# Patient Record
Sex: Female | Born: 1955 | Race: White | Hispanic: No | Marital: Married | Smoking: Former smoker
Health system: Southern US, Community
[De-identification: ages and names within clinical notes are randomized; demographics above are authoritative.]

## PROBLEM LIST (undated history)

## (undated) DIAGNOSIS — M810 Age-related osteoporosis without current pathological fracture: Secondary | ICD-10-CM

## (undated) DIAGNOSIS — I10 Essential (primary) hypertension: Secondary | ICD-10-CM

## (undated) DIAGNOSIS — J45909 Unspecified asthma, uncomplicated: Secondary | ICD-10-CM

## (undated) DIAGNOSIS — K219 Gastro-esophageal reflux disease without esophagitis: Secondary | ICD-10-CM

## (undated) DIAGNOSIS — T7840XA Allergy, unspecified, initial encounter: Secondary | ICD-10-CM

## (undated) HISTORY — PX: NASAL SEPTUM SURGERY: SHX37

## (undated) HISTORY — DX: Gastro-esophageal reflux disease without esophagitis: K21.9

## (undated) HISTORY — PX: COLONOSCOPY: SHX174

## (undated) HISTORY — DX: Age-related osteoporosis without current pathological fracture: M81.0

## (undated) HISTORY — DX: Allergy, unspecified, initial encounter: T78.40XA

## (undated) HISTORY — DX: Unspecified asthma, uncomplicated: J45.909

## (undated) HISTORY — DX: Essential (primary) hypertension: I10

---

## 2000-01-03 ENCOUNTER — Other Ambulatory Visit: Admission: RE | Admit: 2000-01-03 | Discharge: 2000-01-03 | Payer: Self-pay | Admitting: Family Medicine

## 2000-02-28 ENCOUNTER — Other Ambulatory Visit: Admission: RE | Admit: 2000-02-28 | Discharge: 2000-02-28 | Payer: Self-pay | Admitting: Gynecology

## 2000-02-28 ENCOUNTER — Encounter (INDEPENDENT_AMBULATORY_CARE_PROVIDER_SITE_OTHER): Payer: Self-pay

## 2000-03-20 ENCOUNTER — Other Ambulatory Visit: Admission: RE | Admit: 2000-03-20 | Discharge: 2000-03-20 | Payer: Self-pay | Admitting: Gastroenterology

## 2000-03-20 ENCOUNTER — Encounter (INDEPENDENT_AMBULATORY_CARE_PROVIDER_SITE_OTHER): Payer: Self-pay | Admitting: Specialist

## 2000-10-23 ENCOUNTER — Other Ambulatory Visit: Admission: RE | Admit: 2000-10-23 | Discharge: 2000-10-23 | Payer: Self-pay | Admitting: Gynecology

## 2001-11-26 ENCOUNTER — Other Ambulatory Visit: Admission: RE | Admit: 2001-11-26 | Discharge: 2001-11-26 | Payer: Self-pay | Admitting: Gynecology

## 2002-02-18 ENCOUNTER — Other Ambulatory Visit: Admission: RE | Admit: 2002-02-18 | Discharge: 2002-02-18 | Payer: Self-pay | Admitting: Gynecology

## 2003-09-29 ENCOUNTER — Other Ambulatory Visit: Admission: RE | Admit: 2003-09-29 | Discharge: 2003-09-29 | Payer: Self-pay | Admitting: Gynecology

## 2004-11-28 ENCOUNTER — Other Ambulatory Visit: Admission: RE | Admit: 2004-11-28 | Discharge: 2004-11-28 | Payer: Self-pay | Admitting: Gynecology

## 2005-07-25 ENCOUNTER — Other Ambulatory Visit: Admission: RE | Admit: 2005-07-25 | Discharge: 2005-07-25 | Payer: Self-pay | Admitting: Gynecology

## 2008-12-29 ENCOUNTER — Encounter: Admission: RE | Admit: 2008-12-29 | Discharge: 2008-12-29 | Payer: Self-pay | Admitting: Allergy and Immunology

## 2009-10-19 ENCOUNTER — Encounter: Admission: RE | Admit: 2009-10-19 | Discharge: 2009-10-19 | Payer: Self-pay | Admitting: Allergy and Immunology

## 2010-09-26 ENCOUNTER — Ambulatory Visit (HOSPITAL_COMMUNITY): Admission: RE | Admit: 2010-09-26 | Discharge: 2010-09-26 | Payer: Self-pay | Admitting: Gastroenterology

## 2016-08-08 ENCOUNTER — Other Ambulatory Visit: Payer: Self-pay | Admitting: Family Medicine

## 2016-08-08 ENCOUNTER — Ambulatory Visit
Admission: RE | Admit: 2016-08-08 | Discharge: 2016-08-08 | Disposition: A | Discharge status: Home / Self Care | Payer: BLUE CROSS/BLUE SHIELD | Source: Ambulatory Visit | Attending: Family Medicine | Admitting: Family Medicine

## 2016-08-08 DIAGNOSIS — R52 Pain, unspecified: Secondary | ICD-10-CM

## 2016-08-15 ENCOUNTER — Ambulatory Visit (INDEPENDENT_AMBULATORY_CARE_PROVIDER_SITE_OTHER): Payer: BLUE CROSS/BLUE SHIELD | Admitting: Podiatry

## 2016-08-15 ENCOUNTER — Encounter: Payer: Self-pay | Admitting: Podiatry

## 2016-08-15 ENCOUNTER — Ambulatory Visit (INDEPENDENT_AMBULATORY_CARE_PROVIDER_SITE_OTHER): Payer: BLUE CROSS/BLUE SHIELD

## 2016-08-15 VITALS — BP 149/86 | HR 92 | Resp 16 | Ht 64.0 in | Wt 160.0 lb

## 2016-08-15 DIAGNOSIS — M25572 Pain in left ankle and joints of left foot: Secondary | ICD-10-CM | POA: Diagnosis not present

## 2016-08-15 DIAGNOSIS — M722 Plantar fascial fibromatosis: Secondary | ICD-10-CM

## 2016-08-15 MED ORDER — TRIAMCINOLONE ACETONIDE 10 MG/ML IJ SUSP
10.0000 mg | Freq: Once | INTRAMUSCULAR | Status: AC
  Administered 2016-08-15: 10 mg

## 2016-08-15 MED ORDER — DICLOFENAC SODIUM 75 MG PO TBEC: 75.0000 mg | DELAYED_RELEASE_TABLET | Freq: Two times a day (BID) | ORAL | 2 refills | Status: DC

## 2016-08-15 NOTE — Progress Notes (Signed)
   Subjective:    Patient ID: Carol Odom, female    DOB: [birth year], 60 y.o.   MRN: [id number]  HPI  Chief Complaint  Patient presents with  . Foot Pain    L heel pain x over a yr.  Also, some anteroir ankle pain and top of foot "kinda goes along with the heel pain."         Review of Systems  All other systems reviewed and are negative.      Objective:   Physical Exam        Assessment & Plan:

## 2016-08-17 NOTE — Progress Notes (Signed)
Subjective:     Patient ID: Carol Odom, female   DOB: 1955/11/24, 60 y.o.   MRN: [id number]  HPI patient presents stating that she has a lot of discomfort in the plantar heel left and it's been present for around a year and she has tried to modify shoes over-the-counter insoles and stretching exercises without relief   Review of Systems  All other systems reviewed and are negative.      Objective:   Physical Exam  Constitutional: She is oriented to person, place, and time.  Cardiovascular: Intact distal pulses.   Musculoskeletal: Normal range of motion.  Neurological: She is oriented to person, place, and time.  Skin: Skin is warm.  Nursing note and vitals reviewed.  Neurovascular status intact muscle strength adequate range of motion within normal limits with patient noted to have quite a bit of discomfort in the plantar left heel at the insertional point of the tendon into the calcaneus with inflammation and fluid around the medial band. Also is noted to have ankle pain left that's localized in nature with no proximal problems     Assessment:     Acute plantar fasciitis left with inflammation and fluid of the plantar heel noted with probable compensatory pain with all of this been present for a long time    Plan:     H&P and x-rays reviewed and at this time I did discuss the acute and chronic nature of condition. I injected the plantar fascial left 3 mg Kenalog 5 mg Xylocaine and applied fascial brace with instructions on usage and discussed considerations for long-term orthotics and that also due to the long-standing nature of this it may be a surgical consideration. Reappoint to recheck to evaluate in 2 weeks  X-ray report indicate spur formation with no indications of stress fracture arthritis

## 2016-08-27 ENCOUNTER — Encounter: Payer: Self-pay | Admitting: Podiatry

## 2016-08-27 ENCOUNTER — Ambulatory Visit (INDEPENDENT_AMBULATORY_CARE_PROVIDER_SITE_OTHER): Payer: BLUE CROSS/BLUE SHIELD | Admitting: Podiatry

## 2016-08-27 DIAGNOSIS — M722 Plantar fascial fibromatosis: Secondary | ICD-10-CM

## 2016-08-27 MED ORDER — DICLOFENAC SODIUM 75 MG PO TBEC: 75.0000 mg | DELAYED_RELEASE_TABLET | Freq: Two times a day (BID) | ORAL | 2 refills | Status: DC

## 2016-08-27 MED ORDER — TRIAMCINOLONE ACETONIDE 10 MG/ML IJ SUSP
10.0000 mg | Freq: Once | INTRAMUSCULAR | Status: AC
Start: 2016-08-27 — End: 2016-08-27
  Administered 2016-08-27: 10 mg

## 2016-08-27 NOTE — Patient Instructions (Signed)

## 2016-08-27 NOTE — Progress Notes (Signed)
Subjective:     Patient ID: Carol Odom, female   DOB: 01-11-1956, 60 y.o.   MRN: [id number]  HPI patient presents stating my heel is still hurting me with mild improvement but pain still noted   Review of Systems     Objective:   Physical Exam Neurovascular status intact muscle strength is adequate with discomfort plantar aspect left heel which is a bit improved but is still painful when pressed with moderate depression of the arch    Assessment:     Fasciitis-like symptoms with fluid buildup around the medial band    Plan:     H&P condition reviewed exercises dispensed and begin mobile big 15 mg daily. Reinjected plantar fascia 3 mg Kenalog 5 mg Xylocaine and scanned for custom products

## 2016-09-19 ENCOUNTER — Ambulatory Visit: Payer: BLUE CROSS/BLUE SHIELD

## 2016-09-19 DIAGNOSIS — M722 Plantar fascial fibromatosis: Secondary | ICD-10-CM

## 2016-09-19 NOTE — Patient Instructions (Signed)

## 2017-05-08 ENCOUNTER — Encounter: Payer: Self-pay | Admitting: Podiatry

## 2017-05-08 ENCOUNTER — Ambulatory Visit (INDEPENDENT_AMBULATORY_CARE_PROVIDER_SITE_OTHER): Payer: BLUE CROSS/BLUE SHIELD | Admitting: Podiatry

## 2017-05-08 ENCOUNTER — Ambulatory Visit: Payer: BLUE CROSS/BLUE SHIELD | Admitting: Podiatry

## 2017-05-08 ENCOUNTER — Ambulatory Visit (INDEPENDENT_AMBULATORY_CARE_PROVIDER_SITE_OTHER): Payer: BLUE CROSS/BLUE SHIELD

## 2017-05-08 DIAGNOSIS — R609 Edema, unspecified: Secondary | ICD-10-CM | POA: Diagnosis not present

## 2017-05-08 DIAGNOSIS — M778 Other enthesopathies, not elsewhere classified: Secondary | ICD-10-CM

## 2017-05-08 DIAGNOSIS — M7750 Other enthesopathy of unspecified foot: Secondary | ICD-10-CM

## 2017-05-08 DIAGNOSIS — M779 Enthesopathy, unspecified: Secondary | ICD-10-CM

## 2017-05-08 MED ORDER — TRIAMCINOLONE ACETONIDE 10 MG/ML IJ SUSP
10.0000 mg | Freq: Once | INTRAMUSCULAR | Status: AC
  Administered 2017-05-08: 10 mg

## 2017-05-08 NOTE — Progress Notes (Signed)
Subjective:    Patient ID: Carol Odom, female   DOB: 61 y.o.   MRN: [id number]   HPI patient presents stating she's getting a lot of pain in the outside of the left foot    ROS      Objective:  Physical Exam neurovascular status intact with inflammation pain in the left sinus tarsi with fluid buildup noted     Assessment:   Probable sinus tarsitis left      Plan:   Injected the sinus tarsi left 3 mg Kenalog 5 mg Xylocaine and instructed on physical therapy and reappoint to recheck

## 2020-08-21 ENCOUNTER — Other Ambulatory Visit: Payer: Self-pay | Admitting: Family Medicine

## 2020-08-21 DIAGNOSIS — Z1231 Encounter for screening mammogram for malignant neoplasm of breast: Secondary | ICD-10-CM

## 2020-08-21 DIAGNOSIS — Z13820 Encounter for screening for osteoporosis: Secondary | ICD-10-CM

## 2020-08-23 ENCOUNTER — Ambulatory Visit
Admission: RE | Admit: 2020-08-23 | Discharge: 2020-08-23 | Disposition: A | Discharge status: Home / Self Care | Payer: Medicare HMO | Source: Ambulatory Visit | Attending: Family Medicine | Admitting: Family Medicine

## 2020-08-23 ENCOUNTER — Other Ambulatory Visit: Payer: Self-pay

## 2020-08-23 DIAGNOSIS — Z1231 Encounter for screening mammogram for malignant neoplasm of breast: Secondary | ICD-10-CM

## 2020-12-10 ENCOUNTER — Other Ambulatory Visit: Payer: Self-pay

## 2020-12-10 ENCOUNTER — Ambulatory Visit
Admission: RE | Admit: 2020-12-10 | Discharge: 2020-12-10 | Disposition: A | Discharge status: Home / Self Care | Payer: BLUE CROSS/BLUE SHIELD | Source: Ambulatory Visit | Attending: Family Medicine | Admitting: Family Medicine

## 2020-12-10 DIAGNOSIS — Z13820 Encounter for screening for osteoporosis: Secondary | ICD-10-CM

## 2021-01-25 ENCOUNTER — Telehealth: Payer: Self-pay | Admitting: Podiatry

## 2021-01-25 NOTE — Telephone Encounter (Signed)
Pt called asking about orthotics. She got a pair several years ago and was wanting to discuss another pr. Pt has [place] and I explained that the code we use for orthotics  does not meet criteria for medicare to cover them and they are 438.00. She said ok and was going to hang up and I told her about the option of refurbishing the old ones and she said that is great and is aware that it is 90.00 and is due when pt drops off the orthotics to be refurbished. I told pt they take a few weeks to refurbish and I call when they come in for pt to pick up. She said ok great and thank you.

## 2021-02-26 DIAGNOSIS — M722 Plantar fascial fibromatosis: Secondary | ICD-10-CM

## 2021-03-14 ENCOUNTER — Telehealth: Payer: Self-pay | Admitting: Podiatry

## 2021-03-14 NOTE — Telephone Encounter (Signed)
Refurbished orthotics in.. pt aware ok to pick up.

## 2021-03-19 ENCOUNTER — Ambulatory Visit (INDEPENDENT_AMBULATORY_CARE_PROVIDER_SITE_OTHER): Payer: Medicare HMO | Admitting: Podiatry

## 2021-03-19 ENCOUNTER — Other Ambulatory Visit: Payer: Self-pay

## 2021-03-19 DIAGNOSIS — M779 Enthesopathy, unspecified: Secondary | ICD-10-CM

## 2021-03-19 NOTE — Progress Notes (Signed)
Patient presents today stating the the orthotics that she got were too long. Patient also stated that she had yet to put them in the shoes.  I trimmed the orthotics down and they did go in the shoes and were comfortable and patient would like to have a metatarsal pad put in the arch area due to the over the counter orthotics which the patient is wearing feels better with them.  Patient will be contacted when the orthotics are ready for pick up.

## 2021-04-03 ENCOUNTER — Other Ambulatory Visit: Payer: Self-pay

## 2021-04-03 ENCOUNTER — Ambulatory Visit (INDEPENDENT_AMBULATORY_CARE_PROVIDER_SITE_OTHER): Payer: Medicare HMO | Admitting: Podiatry

## 2021-04-03 DIAGNOSIS — M779 Enthesopathy, unspecified: Secondary | ICD-10-CM

## 2021-04-03 NOTE — Patient Instructions (Signed)

## 2021-04-03 NOTE — Progress Notes (Signed)
Patient presents today for orthotic pick up. Patient voices no new complaints.  Orthotics were fitted to patient's feet. No discomfort and no rubbing. Patient satisfied with the orthotics.  Orthotics were dispensed to patient with instructions for break in wear and to call the office with any concerns or questions. 

## 2022-01-07 DIAGNOSIS — K5190 Ulcerative colitis, unspecified, without complications: Secondary | ICD-10-CM | POA: Diagnosis not present

## 2022-01-13 ENCOUNTER — Other Ambulatory Visit: Payer: Self-pay | Admitting: Family Medicine

## 2022-01-13 DIAGNOSIS — Z1231 Encounter for screening mammogram for malignant neoplasm of breast: Secondary | ICD-10-CM

## 2022-01-23 ENCOUNTER — Ambulatory Visit
Admission: RE | Admit: 2022-01-23 | Discharge: 2022-01-23 | Disposition: A | Discharge status: Home / Self Care | Payer: Medicare HMO | Source: Ambulatory Visit | Attending: Family Medicine | Admitting: Family Medicine

## 2022-01-23 DIAGNOSIS — Z1231 Encounter for screening mammogram for malignant neoplasm of breast: Secondary | ICD-10-CM | POA: Diagnosis not present

## 2022-04-30 ENCOUNTER — Ambulatory Visit (INDEPENDENT_AMBULATORY_CARE_PROVIDER_SITE_OTHER): Payer: Medicare HMO | Admitting: Nurse Practitioner

## 2022-04-30 ENCOUNTER — Encounter: Payer: Self-pay | Admitting: Nurse Practitioner

## 2022-04-30 VITALS — BP 122/69 | HR 88 | Temp 97.5°F | Ht 64.17 in | Wt 153.8 lb

## 2022-04-30 DIAGNOSIS — Z8719 Personal history of other diseases of the digestive system: Secondary | ICD-10-CM | POA: Insufficient documentation

## 2022-04-30 DIAGNOSIS — Z7689 Persons encountering health services in other specified circumstances: Secondary | ICD-10-CM | POA: Diagnosis not present

## 2022-04-30 DIAGNOSIS — J4520 Mild intermittent asthma, uncomplicated: Secondary | ICD-10-CM | POA: Diagnosis not present

## 2022-04-30 DIAGNOSIS — K219 Gastro-esophageal reflux disease without esophagitis: Secondary | ICD-10-CM

## 2022-04-30 DIAGNOSIS — Z1211 Encounter for screening for malignant neoplasm of colon: Secondary | ICD-10-CM | POA: Diagnosis not present

## 2022-04-30 MED ORDER — OMEPRAZOLE 40 MG PO CPDR: 40.0000 mg | DELAYED_RELEASE_CAPSULE | Freq: Two times a day (BID) | ORAL | 1 refills | Status: DC

## 2022-04-30 NOTE — Progress Notes (Signed)
New Patient Office Visit  Subjective    Patient ID: Carol Odom, female    DOB: 1956/09/26  Age: 66 y.o. MRN: [id number]  CC:  Chief Complaint  Patient presents with   New Patient (Initial Visit)    HPI Carol Odom presents to establish care Her previous primary care provider has retired.  -needs referral to GI for colonoscopy. Recently did yearly colon cancer screening through [facility] and was told she "failed." She does have history of ulcerative colitis and takes [place]. Insurance will not cover this medication for her any longer.  She is due to have routine, fasting blood work and routine physical.   Outpatient Encounter Medications as of 04/30/2022  Medication Sig   acetaminophen (TYLENOL) 325 MG tablet Take by mouth.   albuterol (VENTOLIN HFA) 108 (90 Base) MCG/ACT inhaler SMARTSIG:2 Puff(s) By Mouth Every 6 Hours PRN   Ascorbic Acid (VITAMIN C) 1000 MG tablet Take 1,000 mg by mouth daily.   dicyclomine (BENTYL) 20 MG tablet Take 20-40 mg by mouth every 6 (six) hours as needed.   mesalamine (LIALDA) 1.2 g EC tablet Take by mouth daily with breakfast.   SYMBICORT 80-4.5 MCG/ACT inhaler Inhale 2 puffs into the lungs daily. Pt takes 2 puffs in the morning and 2 puffs in the evening   Vitamin D, Ergocalciferol, (DRISDOL) 1.25 MG (50000 UNIT) CAPS capsule Take 50,000 Units by mouth every 7 (seven) days.   [DISCONTINUED] omeprazole (PRILOSEC) 20 MG capsule Take 20 mg by mouth 2 (two) times daily.   omeprazole (PRILOSEC) 40 MG capsule Take 1 capsule (40 mg total) by mouth 2 (two) times daily.   [DISCONTINUED] ALPRAZolam (XANAX) 0.5 MG tablet Take 0.5 mg by mouth as needed.    [DISCONTINUED] citalopram (CELEXA) 20 MG tablet Take 20 mg by mouth daily.    [DISCONTINUED] diclofenac (VOLTAREN) 75 MG EC tablet Take 1 tablet (75 mg total) by mouth 2 (two) times daily.   No facility-administered encounter medications on file as of 04/30/2022.    History reviewed. No pertinent past  medical history.  History reviewed. No pertinent surgical history.  Family History  Problem Relation Age of Onset   Stroke Mother    High Cholesterol Mother    High Cholesterol Father    Cancer Father    Heart attack Father    High blood pressure Father    Breast cancer Paternal Aunt     Social History   Socioeconomic History   Marital status: Married    Spouse name: Not on file   Number of children: Not on file   Years of education: Not on file   Highest education level: Not on file  Occupational History   Not on file  Tobacco Use   Smoking status: Former   Smokeless tobacco: Never  Vaping Use   Vaping Use: Not on file  Substance and Sexual Activity   Alcohol use: Never   Drug use: Never   Sexual activity: Not Currently  Other Topics Concern   Not on file  Social History Narrative   Not on file   Social Determinants of Health   Financial Resource Strain: Not on file  Food Insecurity: Not on file  Transportation Needs: Not on file  Physical Activity: Not on file  Stress: Not on file  Social Connections: Not on file  Intimate Partner Violence: Not on file    Review of Systems  Constitutional:  Negative for chills, fever and malaise/fatigue.  HENT:  Negative for  congestion, sinus pain and sore throat.   Eyes: Negative.   Respiratory:  Negative for cough, shortness of breath and wheezing.   Cardiovascular:  Negative for chest pain, palpitations and leg swelling.  Gastrointestinal:  Negative for constipation, diarrhea, nausea and vomiting.       Positive yearly colon cancer screen through insurance.  History of ulcerative colitis.   Genitourinary: Negative.   Musculoskeletal:  Negative for myalgias.  Skin: Negative.   Neurological:  Negative for dizziness and headaches.  Endo/Heme/Allergies:  Does not bruise/bleed easily.  Psychiatric/Behavioral:  Negative for depression. The patient is not nervous/anxious.         Objective    Today's Vitals    04/30/22 1350  BP: 122/69  Pulse: 88  Temp: (!) 97.5 F (36.4 C)  SpO2: 99%  Weight: 153 lb 12.8 oz (69.8 kg)  Height: 5' 4.17" (1.63 m)   Body mass index is 26.26 kg/m.   Physical Exam Vitals and nursing note reviewed.  Constitutional:      Appearance: Normal appearance. She is well-developed.  HENT:     Head: Normocephalic and atraumatic.  Eyes:     Pupils: Pupils are equal, round, and reactive to light.  Neck:     Vascular: No carotid bruit.  Cardiovascular:     Rate and Rhythm: Normal rate and regular rhythm.     Pulses: Normal pulses.     Heart sounds: Normal heart sounds.  Pulmonary:     Effort: Pulmonary effort is normal.     Breath sounds: Normal breath sounds.  Abdominal:     Palpations: Abdomen is soft.  Musculoskeletal:        General: Normal range of motion.     Cervical back: Normal range of motion and neck supple.  Lymphadenopathy:     Cervical: No cervical adenopathy.  Skin:    General: Skin is warm and dry.     Capillary Refill: Capillary refill takes less than 2 seconds.  Neurological:     General: No focal deficit present.     Mental Status: She is alert and oriented to person, place, and time.  Psychiatric:        Mood and Affect: Mood normal.        Behavior: Behavior normal.        Thought Content: Thought content normal.        Judgment: Judgment normal.      Assessment & Plan:  1. Gastroesophageal reflux disease without esophagitis Change omeprazole to 55m daily and sent new, 90 day prescription to pharmacy  - omeprazole (PRILOSEC) 40 MG capsule; Take 1 capsule (40 mg total) by mouth 2 (two) times daily.  Dispense: 90 capsule; Refill: 1  2. Mild intermittent asthma without complication Stable. Continue inhalers as previously prescribed   3. History of ulcerative colitis Currently on [place]. Insurance no longer covering. Refer to GI for further evaluation.  - Ambulatory referral to Gastroenterology  4. Screening for colon  cancer Patient recently had positive FOBT per insurance company. Refer to GI for colonoscopy.  - Ambulatory referral to Gastroenterology  5. Encounter to establish care Appointment today to establish new primary care provider. Will review records to update patient chart.    Problem List Items Addressed This Visit       Respiratory   Mild intermittent asthma without complication   Relevant Medications   albuterol (VENTOLIN HFA) 108 (90 Base) MCG/ACT inhaler     Digestive   Gastroesophageal reflux disease without esophagitis -  Primary   Relevant Medications   dicyclomine (BENTYL) 20 MG tablet   mesalamine (LIALDA) 1.2 g EC tablet   omeprazole (PRILOSEC) 40 MG capsule     Other   History of ulcerative colitis   Relevant Orders   Ambulatory referral to Gastroenterology   Other Visit Diagnoses     Screening for colon cancer       Relevant Orders   Ambulatory referral to Gastroenterology   Encounter to establish care           Return in about 4 weeks (around 05/28/2022) for medicare wellness, FBW a week prior to visit.   Ronnell Freshwater, NP

## 2022-05-21 ENCOUNTER — Other Ambulatory Visit: Payer: Medicare HMO

## 2022-05-21 DIAGNOSIS — Z0000 Encounter for general adult medical examination without abnormal findings: Secondary | ICD-10-CM

## 2022-05-22 LAB — COMPREHENSIVE METABOLIC PANEL
ALT: 13 IU/L (ref 0–32)
AST: 16 IU/L (ref 0–40)
Albumin/Globulin Ratio: 1.4 (ref 1.2–2.2)
Albumin: 4 g/dL (ref 3.9–4.9)
Alkaline Phosphatase: 92 IU/L (ref 44–121)
BUN/Creatinine Ratio: 8 — ABNORMAL LOW (ref 12–28)
BUN: 5 mg/dL — ABNORMAL LOW (ref 8–27)
Bilirubin Total: <0.2 mg/dL (ref 0.0–1.2)
CO2: 23 mmol/L (ref 20–29)
Calcium: 9.9 mg/dL (ref 8.7–10.3)
Chloride: 105 mmol/L (ref 96–106)
Creatinine, Ser: 0.66 mg/dL (ref 0.57–1.00)
Globulin, Total: 2.9 g/dL (ref 1.5–4.5)
Glucose: 96 mg/dL (ref 70–99)
Potassium: 4.4 mmol/L (ref 3.5–5.2)
Sodium: 143 mmol/L (ref 134–144)
Total Protein: 6.9 g/dL (ref 6.0–8.5)
eGFR: 96 mL/min/{1.73_m2} (ref >59)

## 2022-05-22 LAB — LIPID PANEL
Chol/HDL Ratio: 3.7 ratio (ref 0.0–4.4)
Cholesterol, Total: 253 mg/dL — ABNORMAL HIGH (ref 100–199)
HDL: 69 mg/dL (ref >39)
LDL Chol Calc (NIH): 160 mg/dL — ABNORMAL HIGH (ref 0–99)
Triglycerides: 138 mg/dL (ref 0–149)
VLDL Cholesterol Cal: 24 mg/dL (ref 5–40)

## 2022-05-22 LAB — CBC
Hematocrit: 43.6 % (ref 34.0–46.6)
Hemoglobin: 14.3 g/dL (ref 11.1–15.9)
MCH: 29.8 pg (ref 26.6–33.0)
MCHC: 32.8 g/dL (ref 31.5–35.7)
MCV: 91 fL (ref 79–97)
Platelets: 369 10*3/uL (ref 150–450)
RBC: 4.8 x10E6/uL (ref 3.77–5.28)
RDW: 13.1 % (ref 11.7–15.4)
WBC: 4.3 10*3/uL (ref 3.4–10.8)

## 2022-05-22 LAB — TSH: TSH: 0.804 u[IU]/mL (ref 0.450–4.500)

## 2022-05-22 LAB — HEMOGLOBIN A1C
Est. average glucose Bld gHb Est-mCnc: 114 mg/dL
Hgb A1c MFr Bld: 5.6 % (ref 4.8–5.6)

## 2022-05-25 NOTE — Progress Notes (Signed)
Elevated LDL and total cholesterol. Other labs good. Discuss at visit 05/28/2022.

## 2022-05-28 ENCOUNTER — Encounter: Payer: Self-pay | Admitting: Nurse Practitioner

## 2022-05-28 ENCOUNTER — Ambulatory Visit (INDEPENDENT_AMBULATORY_CARE_PROVIDER_SITE_OTHER): Payer: Medicare HMO | Admitting: Nurse Practitioner

## 2022-05-28 VITALS — BP 145/69 | HR 80 | Ht 64.0 in | Wt 154.1 lb

## 2022-05-28 DIAGNOSIS — E785 Hyperlipidemia, unspecified: Secondary | ICD-10-CM | POA: Diagnosis not present

## 2022-05-28 DIAGNOSIS — Z1211 Encounter for screening for malignant neoplasm of colon: Secondary | ICD-10-CM | POA: Diagnosis not present

## 2022-05-28 DIAGNOSIS — Z0000 Encounter for general adult medical examination without abnormal findings: Secondary | ICD-10-CM

## 2022-05-28 NOTE — Progress Notes (Signed)
Subjective:   Carol Odom is a 66 y.o. female who presents for Medicare Annual (Subsequent) preventive examination.  Review of Systems    Referrer to PCP    I connected with  Ophelia Shoulder on 05/28/22 by In person and verified that I am speaking with the correct person using two identifiers.   I discussed the limitations, risks, security and privacy concerns of performing an evaluation and management service by telephone and the availability of in person appointments. I also discussed with the patient that there may be a patient responsible charge related to this service. The patient expressed understanding and verbally consented to this telephonic visit.  Location of Patient: office Location of Provider:office  List any persons and their role that are participating in the visit with the patient.  Katrina     Objective:    Today's Vitals   05/28/22 0926  BP: (!) 145/69  Pulse: 80  SpO2: 97%  Weight: 154 lb 1.9 oz (69.9 kg)  Height: 5' 4"  (1.626 m)   Body mass index is 26.45 kg/m.   Current Medications (verified) Outpatient Encounter Medications as of 05/28/2022  Medication Sig   acetaminophen (TYLENOL) 325 MG tablet Take by mouth.   albuterol (VENTOLIN HFA) 108 (90 Base) MCG/ACT inhaler SMARTSIG:2 Puff(s) By Mouth Every 6 Hours PRN   Ascorbic Acid (VITAMIN C) 1000 MG tablet Take 1,000 mg by mouth daily.   dicyclomine (BENTYL) 20 MG tablet Take 20-40 mg by mouth every 6 (six) hours as needed.   mesalamine (LIALDA) 1.2 g EC tablet Take by mouth daily with breakfast.   omeprazole (PRILOSEC) 40 MG capsule Take 1 capsule (40 mg total) by mouth 2 (two) times daily.   SYMBICORT 80-4.5 MCG/ACT inhaler Inhale 2 puffs into the lungs daily. Pt takes 2 puffs in the morning and 2 puffs in the evening   Vitamin D, Ergocalciferol, (DRISDOL) 1.25 MG (50000 UNIT) CAPS capsule Take 50,000 Units by mouth every 7 (seven) days.   No facility-administered encounter medications on file as  of 05/28/2022.    Allergies (verified) Codeine, Prednisone, Augmentin [amoxicillin-pot clavulanate], and Clindamycin/lincomycin   History: History reviewed. No pertinent past medical history. History reviewed. No pertinent surgical history. Family History  Problem Relation Age of Onset   Stroke Mother    High Cholesterol Mother    High Cholesterol Father    Cancer Father    Heart attack Father    High blood pressure Father    Breast cancer Paternal Aunt    Social History   Socioeconomic History   Marital status: Married    Spouse name: Not on file   Number of children: Not on file   Years of education: Not on file   Highest education level: Not on file  Occupational History   Not on file  Tobacco Use   Smoking status: Former   Smokeless tobacco: Never  Vaping Use   Vaping Use: Not on file  Substance and Sexual Activity   Alcohol use: Never   Drug use: Never   Sexual activity: Not Currently  Other Topics Concern   Not on file  Social History Narrative   Not on file   Social Determinants of Health   Financial Resource Strain: Low Risk  (05/28/2022)   Overall Financial Resource Strain (CARDIA)    Difficulty of Paying Living Expenses: Not hard at all  Food Insecurity: No Food Insecurity (05/28/2022)   Hunger Vital Sign    Worried About Running Out of  Food in the Last Year: Never true    [place] in the Last Year: Never true  Transportation Needs: No Transportation Needs (05/28/2022)   PRAPARE - Hydrologist (Medical): No    Lack of Transportation (Non-Medical): No  Physical Activity: Not on file  Stress: No Stress Concern Present (05/28/2022)   [place]    Feeling of Stress : Not at all  Social Connections: Not on file    Tobacco Counseling Non smoker   How often do you need to have someone help you when you read instructions, pamphlets, or other written  materials from your doctor or pharmacy?: (P) 1 - Never  Diabetic?no   Activities of Daily Living    05/28/2022    9:28 AM 05/24/2022    9:08 AM  In your present state of health, do you have any difficulty performing the following activities:  Hearing? 0 0  Vision? 0 0  Difficulty concentrating or making decisions? 0 0  Walking or climbing stairs? 0 0  Dressing or bathing? 0 0  Doing errands, shopping? 0 0  Preparing Food and eating ?  N  Using the Toilet?  N  In the past six months, have you accidently leaked urine?  N  Do you have problems with loss of bowel control?  N  Managing your Medications?  N  Managing your Finances?  N  Housekeeping or managing your Housekeeping?  N    Patient Care Team: Ronnell Freshwater, NP as PCP - General (Family Medicine)  Indicate any recent Medical Services you may have received from other than [facility] providers in the past year (date may be approximate).     Assessment:   1. Encounter for Medicare annual wellness exam Medicare wellness visit today   2. Hyperlipidemia LDL goal <100 [place] labs showing mild elevation of LDL and total cholesterol. Recommend patient limit intake of fried and fatty foods. She should increase intake of lean proteins and green leafy vegetables. Adding exercise into daily routine will also be beneficial.  Recheck fasting lipids in one year   3. Screening for colon cancer Refer to GI for screening colonoscopy  - Ambulatory referral to Gastroenterology   Hearing/Vision screen No results found.  Dietary issues and exercise activities discussed:    Depression Screen    05/28/2022    9:28 AM 04/30/2022    2:00 PM  PHQ 2/9 Scores  PHQ - 2 Score 0 0  PHQ- 9 Score 0 1    Fall Risk    05/24/2022    9:08 AM  Fall Risk   Falls in the past year? 0    FALL RISK PREVENTION PERTAINING TO THE HOME:  Any stairs in or around the home? Yes  If so, are there any without handrails? No  Home free of loose throw rugs  in walkways, pet beds, electrical cords, etc? Yes  Adequate lighting in your home to reduce risk of falls? Yes   ASSISTIVE DEVICES UTILIZED TO PREVENT FALLS:  Life alert? No  Use of a cane, walker or w/c? No  Grab bars in the bathroom? No  Shower chair or bench in shower? No  Elevated toilet seat or a handicapped toilet? Yes   TIMED UP AND GO:  Was the test performed? Yes .  Length of time to ambulate 10 feet: 10 sec.   Gait steady and fast without use of assistive device  Cognitive Function:        05/28/2022    9:28 AM  6CIT Screen  What Year? 0 points  What time? 0 points  Count back from 20 0 points  Months in reverse 0 points  Repeat phrase 2 points    Immunizations  There is no immunization history on file for this patient.  TDAP status: Due, Education has been provided regarding the importance of this vaccine. Advised may receive this vaccine at local pharmacy or Health Dept. Aware to provide a copy of the vaccination record if obtained from local pharmacy or Health Dept. Verbalized acceptance and understanding.  Flu Vaccine status: Declined, Education has been provided regarding the importance of this vaccine but patient still declined. Advised may receive this vaccine at local pharmacy or Health Dept. Aware to provide a copy of the vaccination record if obtained from local pharmacy or Health Dept. Verbalized acceptance and understanding.  Pneumococcal vaccine status: Due, Education has been provided regarding the importance of this vaccine. Advised may receive this vaccine at local pharmacy or Health Dept. Aware to provide a copy of the vaccination record if obtained from local pharmacy or Health Dept. Verbalized acceptance and understanding.  Covid-19 vaccine status: Information provided on how to obtain vaccines.   Qualifies for Shingles Vaccine? Yes   Zostavax completed No   Shingrix Completed?: No.    Education has been provided regarding the importance of  this vaccine. Patient has been advised to call insurance company to determine out of pocket expense if they have not yet received this vaccine. Advised may also receive vaccine at local pharmacy or Health Dept. Verbalized acceptance and understanding.  Screening Tests Health Maintenance  Topic Date Due   Hepatitis C Screening  Never done   TETANUS/TDAP  Never done   COLONOSCOPY (Pts 45-74yr Insurance coverage will need to be confirmed)  Never done   COVID-19 Vaccine (1) 06/13/2022 (Originally 02/25/1957)   Zoster Vaccines- Shingrix (1 of 2) 08/28/2022 (Originally 08/26/2004)   Pneumonia Vaccine 68 Years old (1 - PCV) 05/29/2023 (Originally 08/27/2019)   INFLUENZA VACCINE  06/10/2022   MAMMOGRAM  01/24/2024   DEXA SCAN  Completed   HPV VACCINES  Aged Out    Health Maintenance  Health Maintenance Due  Topic Date Due   Hepatitis C Screening  Never done   TETANUS/TDAP  Never done   COLONOSCOPY (Pts 45-419yrInsurance coverage will need to be confirmed)  Never done    Colorectal cancer screening: Referral to GI placed [facility]. Pt aware the office will call re: appt.  Mammogram status: Completed 3/23. Repeat every year  Bone Density status: Completed 11/2020. Results reflect: Bone density results: OSTEOPOROSIS. Repeat every 2 years.  Lung Cancer Screening: (Low Dose CT Chest recommended if Age 68-80ears, 30 pack-year currently smoking OR have quit w/in 15years.) does not qualify.   Lung Cancer Screening Referral: NO  Additional Screening:  Hepatitis C Screening: does not qualify; Completed N/A  Vision Screening: Recommended annual ophthalmology exams for early detection of glaucoma and other disorders of the eye. Is the patient up to date with their annual eye exam?  Yes  Who is the provider or what is the name of the office in which the patient attends annual eye exams? [facility] If pt is not established with a provider, would they like to be referred to a provider to  establish care? No .   Dental Screening: Recommended annual dental exams for proper oral hygiene  Community  Resource Referral / Chronic Care Management: CRR required this visit?  No   CCM required this visit?  No      Plan:     I have personally reviewed and noted the following in the patient's chart:   Medical and social history Use of alcohol, tobacco or illicit drugs  Current medications and supplements including opioid prescriptions.  Functional ability and status Nutritional status Physical activity Advanced directives List of other physicians Hospitalizations, surgeries, and ER visits in previous 12 months Vitals Screenings to include cognitive, depression, and falls Referrals and appointments  In addition, I have reviewed and discussed with patient certain preventive protocols, quality metrics, and best practice recommendations. A written personalized care plan for preventive services as well as general preventive health recommendations were provided to patient.     Ronnell Freshwater, NP   05/28/2022   Nurse Notes: Face to Face 25 min   Ms. Hundertmark , Thank you for taking time to come for your Medicare Wellness Visit. I appreciate your ongoing commitment to your health goals. Please review the following plan we discussed and let me know if I can assist you in the future.   These are the goals we discussed:  Goals   None     This is a list of the screening recommended for you and due dates:  Health Maintenance  Topic Date Due   Hepatitis C Screening: USPSTF Recommendation to screen - Ages 55-79 yo.  Never done   Tetanus Vaccine  Never done   Colon Cancer Screening  Never done   COVID-19 Vaccine (1) 06/13/2022*   Zoster (Shingles) Vaccine (1 of 2) 08/28/2022*   Pneumonia Vaccine (1 - PCV) 05/29/2023*   Flu Shot  06/10/2022   Mammogram  01/24/2024   DEXA scan (bone density measurement)  Completed   HPV Vaccine  Aged Out  *Topic was postponed. The date shown is  not the original due date.

## 2022-06-02 IMAGING — MG MM DIGITAL SCREENING BILAT W/ TOMO AND CAD
8 series · 8 of 24 positions shown · non-contrast
Comparison: Previous exam(s).

CLINICAL DATA: Screening.

EXAM:
DIGITAL SCREENING BILATERAL MAMMOGRAM WITH TOMOSYNTHESIS AND CAD
TECHNIQUE: Bilateral screening digital craniocaudal and mediolateral oblique
mammograms were obtained. Bilateral screening digital breast
tomosynthesis was performed. The images were evaluated with
computer-aided detection.

[R CC synth-2D]
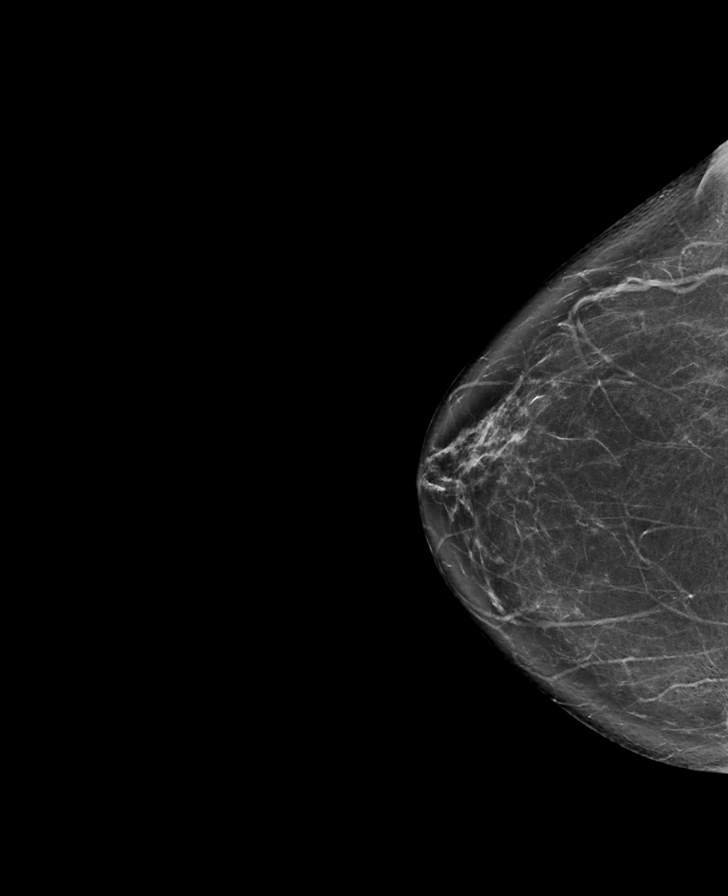

[L CC synth-2D]
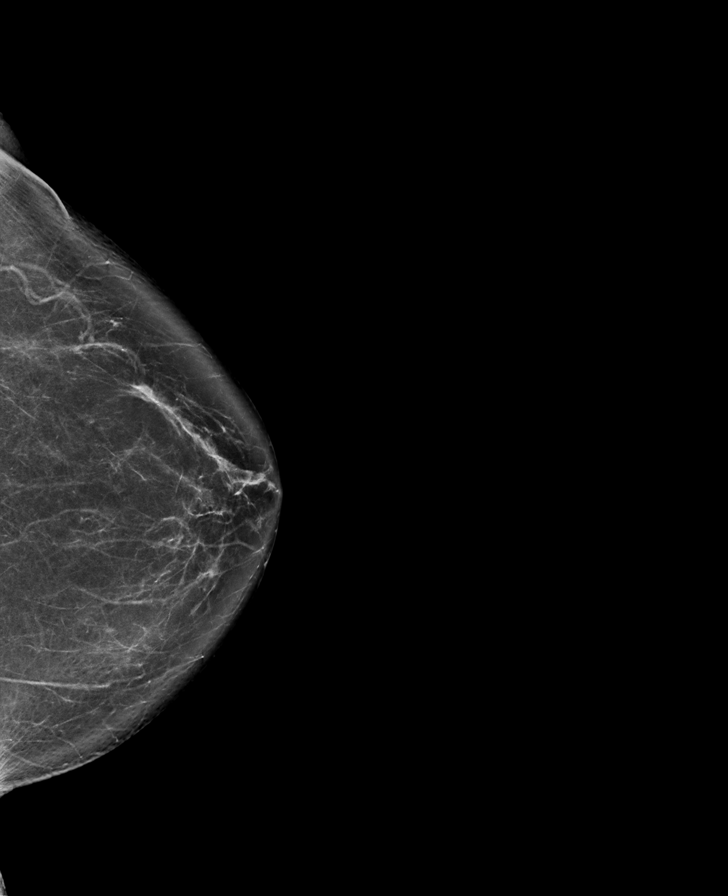

[R MLO synth-2D]
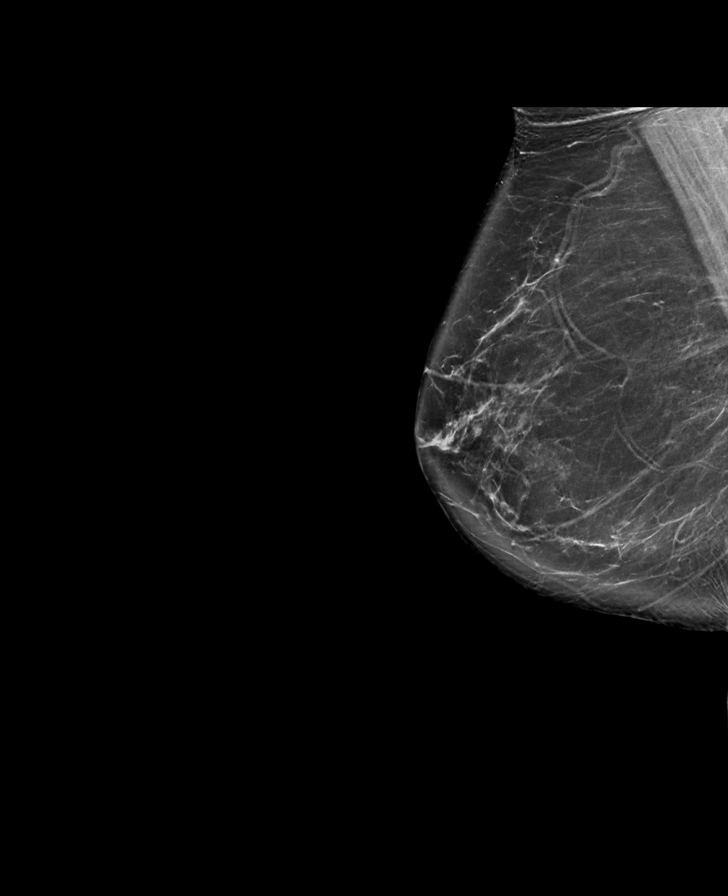

[L MLO synth-2D]
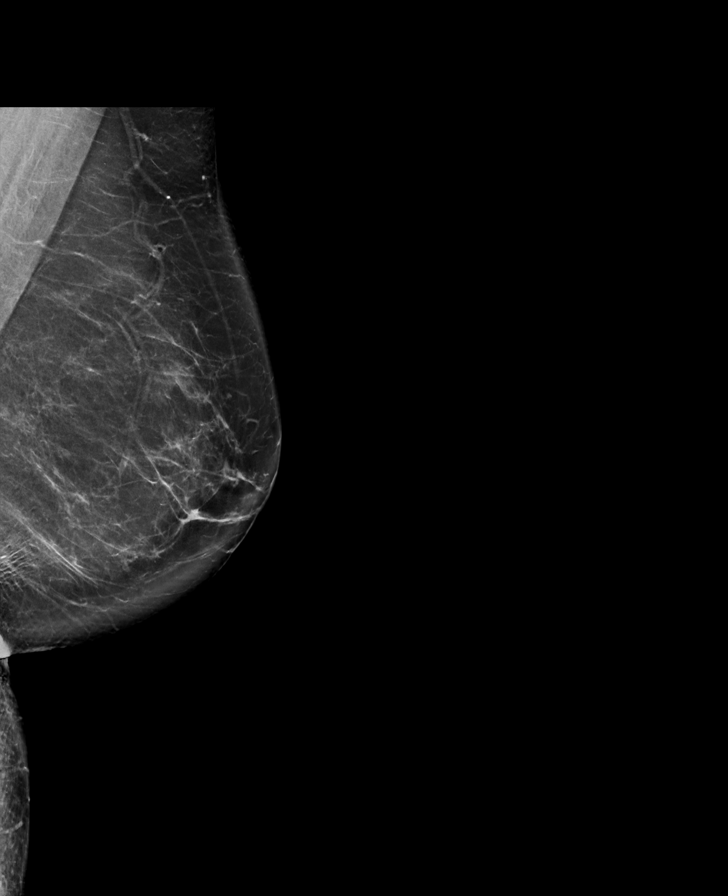

[L MLO tomo · tomo slice 45/88.0]
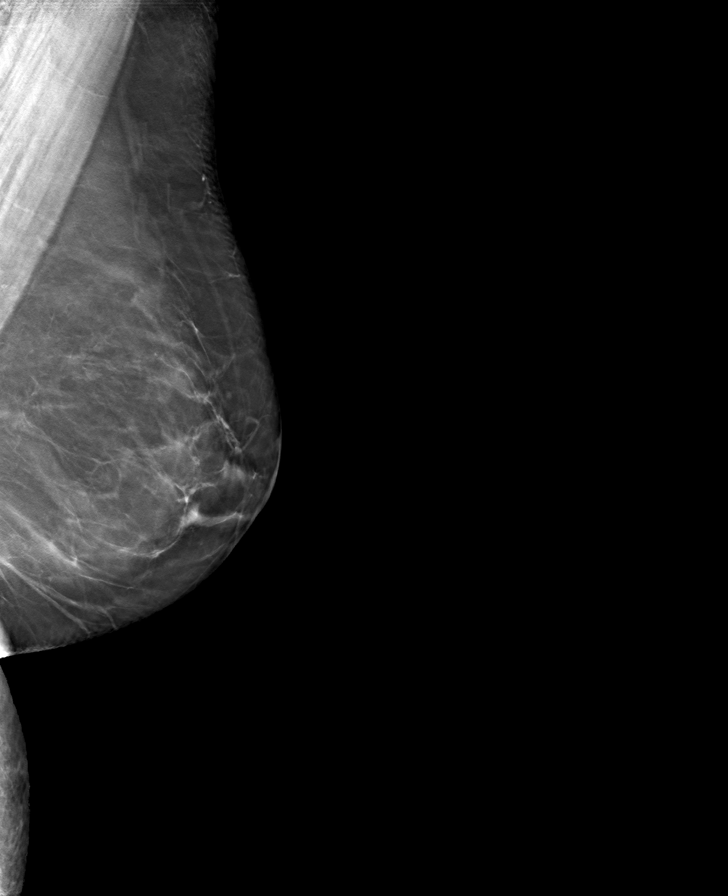

[R CC tomo · tomo slice 42/83.0]
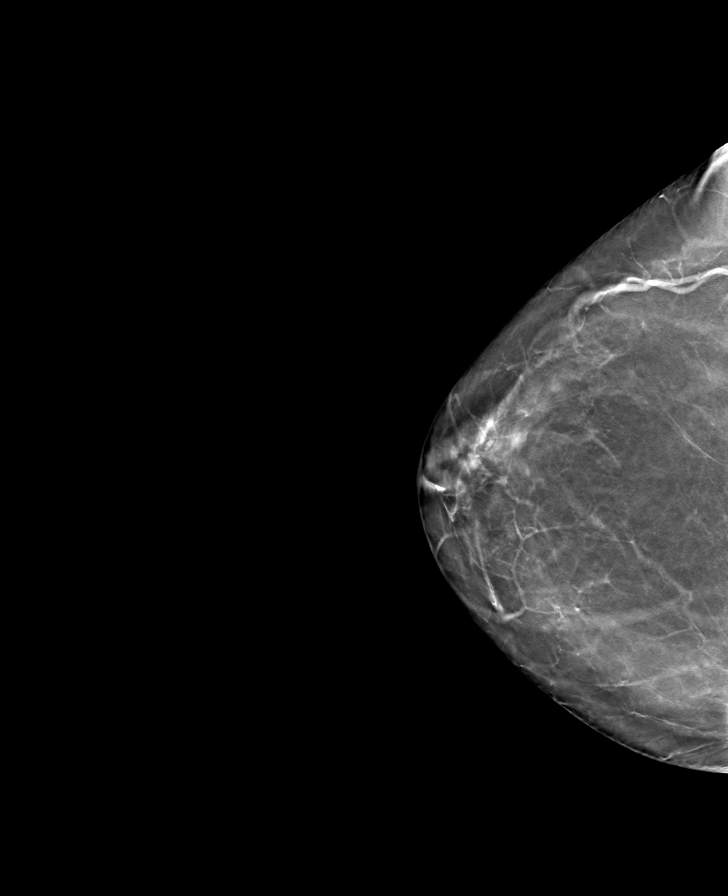

[R MLO tomo · tomo slice 47/92.0]
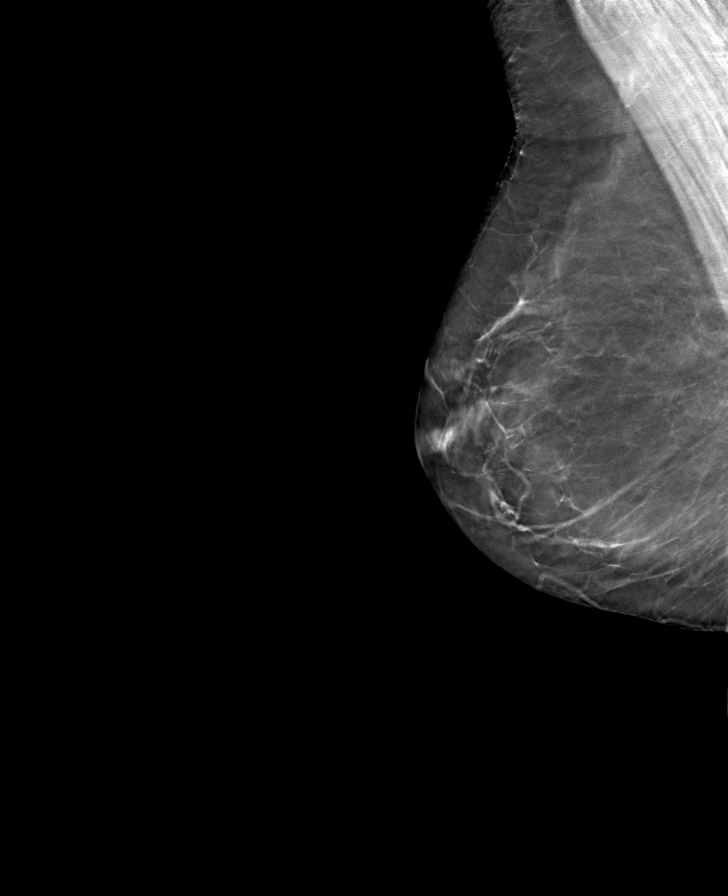

[L CC tomo · tomo slice 43/86.0]
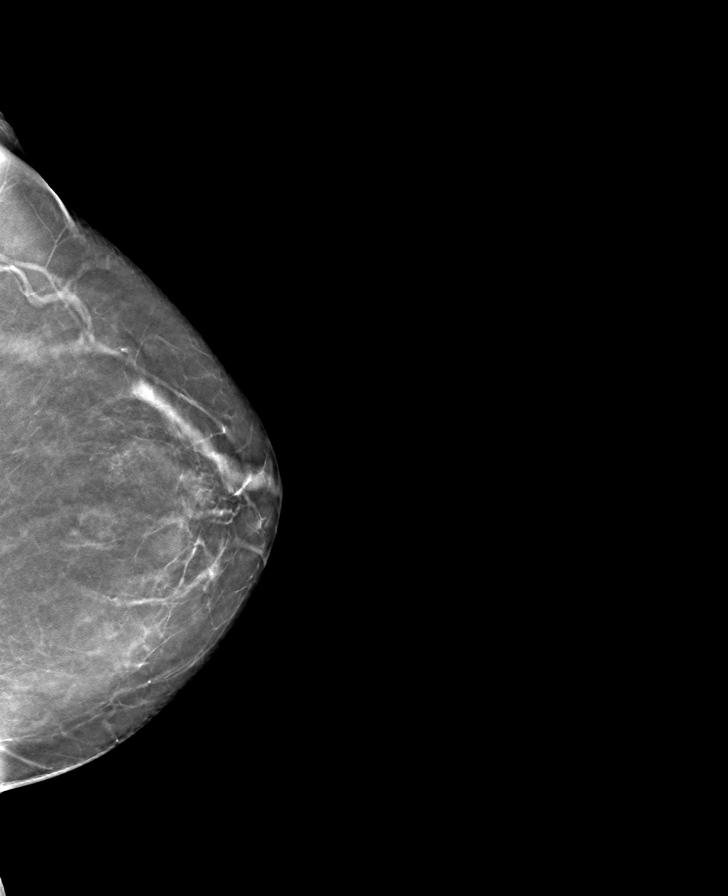

[8 of 24 positions shown; findings below may reference images not displayed]

ACR Breast Density Category b: There are scattered areas of
fibroglandular density.
FINDINGS: There are no findings suspicious for malignancy.
IMPRESSION: No mammographic evidence of malignancy. A result letter of this
screening mammogram will be mailed directly to the patient.

RECOMMENDATION:
Screening mammogram in one year. (Code:51-O-LD2)

BI-RADS CATEGORY  1: Negative.

## 2022-06-11 ENCOUNTER — Encounter: Payer: Self-pay | Admitting: Gastroenterology

## 2022-06-30 ENCOUNTER — Ambulatory Visit (AMBULATORY_SURGERY_CENTER): Payer: Self-pay

## 2022-06-30 VITALS — Ht 64.0 in | Wt 157.0 lb

## 2022-06-30 DIAGNOSIS — K51919 Ulcerative colitis, unspecified with unspecified complications: Secondary | ICD-10-CM

## 2022-06-30 DIAGNOSIS — Z86010 Personal history of colonic polyps: Secondary | ICD-10-CM

## 2022-06-30 MED ORDER — PEG 3350-KCL-NA BICARB-NACL 420 G PO SOLR: 4000.0000 mL | Freq: Once | ORAL | 0 refills | Status: AC

## 2022-06-30 NOTE — Progress Notes (Signed)
No egg or soy allergy known to patient  No issues known to pt with past sedation with any surgeries or procedures Patient denies ever being told they had issues or difficulty with intubation  No FH of Malignant Hyperthermia Pt is not on diet pills Pt is not on  home 02  Pt is not on blood thinners  Pt denies issues with constipation  No A fib or A flutter Have any cardiac testing pending--denied Pt instructed to use [email or web address] or GoodRx for a price reduction on prep

## 2022-07-08 ENCOUNTER — Encounter: Payer: Self-pay | Admitting: Gastroenterology

## 2022-07-24 ENCOUNTER — Encounter: Payer: Self-pay | Admitting: Gastroenterology

## 2022-07-24 ENCOUNTER — Ambulatory Visit (AMBULATORY_SURGERY_CENTER): Payer: Medicare HMO | Admitting: Gastroenterology

## 2022-07-24 VITALS — BP 144/73 | HR 85 | Temp 98.7°F | Resp 13 | Ht 64.0 in | Wt 157.0 lb

## 2022-07-24 DIAGNOSIS — D122 Benign neoplasm of ascending colon: Secondary | ICD-10-CM

## 2022-07-24 DIAGNOSIS — K51919 Ulcerative colitis, unspecified with unspecified complications: Secondary | ICD-10-CM

## 2022-07-24 DIAGNOSIS — Z86010 Personal history of colonic polyps: Secondary | ICD-10-CM

## 2022-07-24 DIAGNOSIS — D125 Benign neoplasm of sigmoid colon: Secondary | ICD-10-CM

## 2022-07-24 DIAGNOSIS — Z09 Encounter for follow-up examination after completed treatment for conditions other than malignant neoplasm: Secondary | ICD-10-CM | POA: Diagnosis not present

## 2022-07-24 DIAGNOSIS — D123 Benign neoplasm of transverse colon: Secondary | ICD-10-CM | POA: Diagnosis not present

## 2022-07-24 DIAGNOSIS — D120 Benign neoplasm of cecum: Secondary | ICD-10-CM | POA: Diagnosis not present

## 2022-07-24 DIAGNOSIS — K621 Rectal polyp: Secondary | ICD-10-CM

## 2022-07-24 MED ORDER — SODIUM CHLORIDE 0.9 % IV SOLN: 500.0000 mL | Freq: Once | INTRAVENOUS | Status: DC

## 2022-07-24 NOTE — Progress Notes (Signed)
Called to room to assist during endoscopic procedure.  Patient ID and intended procedure confirmed with present staff. Received instructions for my participation in the procedure from the performing physician.  

## 2022-07-24 NOTE — Op Note (Signed)
[name] Procedure Date: 07/24/2022 10:46 AM MRN: [id number] Endoscopist: Nicki Reaper E. Candis Schatz , MD Age: 66 Referring MD:  Date of Birth: March 08, 1956 Gender: Female Account #: [id number] Procedure:                Colonoscopy Indications:              High risk colon cancer surveillance: Ulcerative                            pancolitis of 8 (or more) years duration Medicines:                Monitored Anesthesia Care Procedure:                Pre-Anesthesia Assessment:                           - Prior to the procedure, a History and Physical                            was performed, and patient medications and                            allergies were reviewed. The patient's tolerance of                            previous anesthesia was also reviewed. The risks                            and benefits of the procedure and the sedation                            options and risks were discussed with the patient.                            All questions were answered, and informed consent                            was obtained. Prior Anticoagulants: The patient has                            taken no previous anticoagulant or antiplatelet                            agents. ASA Grade Assessment: II - A patient with                            mild systemic disease. After reviewing the risks                            and benefits, the patient was deemed in                            satisfactory condition to undergo the procedure.  After obtaining informed consent, the colonoscope                            was passed under direct vision. Throughout the                            procedure, the patient's blood pressure, pulse, and                            oxygen saturations were monitored continuously. The                            Olympus CF-HQ190L (Serial# [id number]) Colonoscope was                            introduced  through the anus and advanced to the the                            cecum, identified by appendiceal orifice and                            ileocecal valve. The colonoscopy was somewhat                            difficult due to significant looping and a tortuous                            colon. Successful completion of the procedure was                            aided by using manual pressure. The patient                            tolerated the procedure well. The quality of the                            bowel preparation was adequate. The ileocecal                            valve, appendiceal orifice, and rectum were                            photographed. Scope In: 11:02:24 AM Scope Out: 11:30:36 AM Scope Withdrawal Time: 0 hours 17 minutes 54 seconds  Total Procedure Duration: 0 hours 28 minutes 12 seconds  Findings:                 The perianal and digital rectal examinations were                            normal. Pertinent negatives include normal                            sphincter tone and no palpable rectal lesions.  An area of mildly congested mucosa was found in the                            rectum and in the sigmoid colon. Four biopsies were                            taken every 10 cm with a cold forceps from the                            entire colon for ulcerative colitis surveillance.                            These biopsy specimens were sent to Pathology.                            Estimated blood loss was minimal.                           The exam was otherwise normal throughout the                            examined colon.                           The retroflexed view of the distal rectum and anal                            verge was normal and showed no anal or rectal                            abnormalities. Complications:            No immediate complications. Estimated Blood Loss:     Estimated blood loss was  minimal. Impression:               - Congested mucosa in the rectum and in the sigmoid                            colon. Biopsied.                           - The distal rectum and anal verge are normal on                            retroflexion view. Recommendation:           - Patient has a contact number available for                            emergencies. The signs and symptoms of potential                            delayed complications were discussed with the  patient. Return to normal activities tomorrow.                            Written discharge instructions were provided to the                            patient.                           - Resume previous diet.                           - Continue present medications.                           - Await pathology results.                           - Repeat colonoscopy in 2 years for surveillance.                           - Continue mesalamine for UC maintenance therapy.                            Can switch to a different mesalamine formulation if                            cost is an issue. Rankin Coolman E. Candis Schatz, MD 07/24/2022 11:38:42 AM This report has been signed electronically.

## 2022-07-24 NOTE — Progress Notes (Signed)
VS completed by CW.   Pt's states no medical or surgical changes since previsit or office visit.  

## 2022-07-24 NOTE — Progress Notes (Signed)
[facility] History and Physical   Primary Care Physician:  Ronnell Freshwater, NP   Reason for Procedure:   Colon cancer screening  Plan:    Colonoscopy     HPI: Carol Odom is a 66 y.o. female undergoing surveillance colonoscopy.  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  She is taking Lialda intermittently.  Her last colonoscopy was in 2011 and 3 small polyps were removed in the right colon.  She had active colitis noted in the left colon and distal transverse colon. She has no family history of colon cancer.  She is in clinical remission.   Past Medical History:  Diagnosis Date   Asthma    Osteoporosis     Past Surgical History:  Procedure Laterality Date   COLONOSCOPY     NASAL SEPTUM SURGERY     in high school    Prior to Admission medications   Medication Sig Start Date End Date Taking? Authorizing Provider  acetaminophen (TYLENOL) 325 MG tablet Take by mouth.   Yes [provider]  albuterol (VENTOLIN HFA) 108 (90 Base) MCG/ACT inhaler SMARTSIG:2 Puff(s) By Mouth Every 6 Hours PRN 03/07/22  Yes [provider]  Ascorbic Acid (VITAMIN C) 1000 MG tablet Take 1,000 mg by mouth daily.   Yes [provider]  cetirizine (ZYRTEC) 10 MG tablet Take 10 mg by mouth daily.   Yes [provider]  Cholecalciferol (VITAMIN D) 125 MCG (5000 UT) CAPS Take by mouth.   Yes [provider]  dicyclomine (BENTYL) 20 MG tablet Take 20-40 mg by mouth every 6 (six) hours as needed. 03/07/22  Yes [provider]  mesalamine (LIALDA) 1.2 g EC tablet Take by mouth daily with breakfast.   Yes [provider]  omeprazole (PRILOSEC) 40 MG capsule Take 1 capsule (40 mg total) by mouth 2 (two) times daily. 04/30/22  Yes Ronnell Freshwater, NP  SYMBICORT 80-4.5 MCG/ACT inhaler Inhale 2 puffs into the lungs daily. Pt takes 2 puffs in the morning and 2 puffs in the evening 08/01/16  Yes [provider]  Zinc 50 MG TABS Take by mouth.   Yes [provider]  Vitamin D, Ergocalciferol, (DRISDOL) 1.25 MG (50000 UNIT) CAPS capsule Take 50,000 Units by mouth every 7 (seven) days.    [provider]    Current Outpatient Medications  Medication Sig Dispense Refill   acetaminophen (TYLENOL) 325 MG tablet Take by mouth.     albuterol (VENTOLIN HFA) 108 (90 Base) MCG/ACT inhaler SMARTSIG:2 Puff(s) By Mouth Every 6 Hours PRN     Ascorbic Acid (VITAMIN C) 1000 MG tablet Take 1,000 mg by mouth daily.     cetirizine (ZYRTEC) 10 MG tablet Take 10 mg by mouth daily.     Cholecalciferol (VITAMIN D) 125 MCG (5000 UT) CAPS Take by mouth.     dicyclomine (BENTYL) 20 MG tablet Take 20-40 mg by mouth every 6 (six) hours as needed.     mesalamine (LIALDA) 1.2 g EC tablet Take by mouth daily with breakfast.     omeprazole (PRILOSEC) 40 MG capsule Take 1 capsule (40 mg total) by mouth 2 (two) times daily. 90 capsule 1   SYMBICORT 80-4.5 MCG/ACT inhaler Inhale 2 puffs into the lungs daily. Pt takes 2 puffs in the morning and 2 puffs in the evening  1   Zinc 50 MG TABS Take by mouth.     Vitamin D, Ergocalciferol, (DRISDOL) 1.25 MG (  50000 UNIT) CAPS capsule Take 50,000 Units by mouth every 7 (seven) days.     Current Facility-Administered Medications  Medication Dose Route Frequency Provider Last Rate Last Admin   0.9 %  sodium chloride infusion  500 mL Intravenous Once Daryel November, MD        Allergies as of 07/24/2022 - Review Complete 07/24/2022  Allergen Reaction Noted   Codeine Nausea Only 08/15/2016   Prednisone Nausea Only 08/15/2016   Amoxicillin Rash 12/25/2021   Augmentin [amoxicillin-pot clavulanate] Rash 08/15/2016   Clindamycin/lincomycin Rash 08/15/2016    Family History  Problem Relation Age of Onset   Stroke Mother    High Cholesterol Mother    High Cholesterol Father    Cancer Father    Heart attack Father    High blood pressure Father     Breast cancer Paternal Aunt    Colon cancer Neg Hx    Colon polyps Neg Hx    Esophageal cancer Neg Hx    Rectal cancer Neg Hx    Stomach cancer Neg Hx     Social History   Socioeconomic History   Marital status: Married    Spouse name: Not on file   Number of children: Not on file   Years of education: Not on file   Highest education level: Not on file  Occupational History   Not on file  Tobacco Use   Smoking status: Former   Smokeless tobacco: Never  Vaping Use   Vaping Use: Never used  Substance and Sexual Activity   Alcohol use: Never   Drug use: Never   Sexual activity: Not Currently  Other Topics Concern   Not on file  Social History Narrative   Not on file   Social Determinants of Health   Financial Resource Strain: Low Risk  (05/28/2022)   Overall Financial Resource Strain (CARDIA)    Difficulty of Paying Living Expenses: Not hard at all  Food Insecurity: No Food Insecurity (05/28/2022)   Hunger Vital Sign    Worried About Running Out of Food in the Last Year: Never true    Ran Out of Food in the Last Year: Never true  Transportation Needs: No Transportation Needs (05/28/2022)   PRAPARE - Hydrologist (Medical): No    Lack of Transportation (Non-Medical): No  Physical Activity: Not on file  Stress: No Stress Concern Present (05/28/2022)   [place]    Feeling of Stress : Not at all  Social Connections: Not on file  Intimate Partner Violence: Not on file    Review of Systems:  All other review of systems negative except as mentioned in the HPI.  Physical Exam: Vital signs BP (!) 164/76   Pulse 93   Temp 98.7 F (37.1 C) (Temporal)   Ht 5' 4"  (1.626 m)   Wt 157 lb (71.2 kg)   SpO2 99%   BMI 26.95 kg/m   General:   Alert,  Well-developed, well-nourished, pleasant and cooperative in NAD Airway:  Mallampati 2 Lungs:  Clear throughout to auscultation.    Heart:  Regular rate and rhythm; no murmurs, clicks, rubs,  or gallops. Abdomen:  Soft, nontender and nondistended. Normal bowel sounds.   Neuro/Psych:  Normal mood and affect. A and O x 3   Langston Tuberville E. Candis Schatz, MD [facility]

## 2022-07-24 NOTE — Patient Instructions (Signed)
Await pathology results.  Handout on Ulcerative Colitis provided.  YOU HAD AN ENDOSCOPIC PROCEDURE TODAY AT [facility]:   Refer to the procedure report that was given to you for any specific questions about what was found during the examination.  If the procedure report does not answer your questions, please call your gastroenterologist to clarify.  If you requested that your care partner not be given the details of your procedure findings, then the procedure report has been included in a sealed envelope for you to review at your convenience later.  YOU SHOULD EXPECT: Some feelings of bloating in the abdomen. Passage of more gas than usual.  Walking can help get rid of the air that was put into your GI tract during the procedure and reduce the bloating. If you had a lower endoscopy (such as a colonoscopy or flexible sigmoidoscopy) you may notice spotting of blood in your stool or on the toilet paper. If you underwent a bowel prep for your procedure, you may not have a normal bowel movement for a few days.  Please Note:  You might notice some irritation and congestion in your nose or some drainage.  This is from the oxygen used during your procedure.  There is no need for concern and it should clear up in a day or so.  SYMPTOMS TO REPORT IMMEDIATELY:  Following lower endoscopy (colonoscopy or flexible sigmoidoscopy):  Excessive amounts of blood in the stool  Significant tenderness or worsening of abdominal pains  Swelling of the abdomen that is new, acute  Fever of 100F or higher   For urgent or emergent issues, a gastroenterologist can be reached at any hour by calling [phone]. Do not use MyChart messaging for urgent concerns.    DIET:  We do recommend a small meal at first, but then you may proceed to your regular diet.  Drink plenty of fluids but you should avoid alcoholic beverages for 24 hours.  ACTIVITY:  You should plan to take it easy for the rest of today  and you should NOT DRIVE or use heavy machinery until tomorrow (because of the sedation medicines used during the test).    FOLLOW UP: Our staff will call the number listed on your records the next business day following your procedure.  We will call around 7:15- 8:00 am to check on you and address any questions or concerns that you may have regarding the information given to you following your procedure. If we do not reach you, we will leave a message.     If any biopsies were taken you will be contacted by phone or by letter within the next 1-3 weeks.  Please call us at [phone] if you have not heard about the biopsies in 3 weeks.    SIGNATURES/CONFIDENTIALITY: You and/or your care partner have signed paperwork which will be entered into your electronic medical record.  These signatures attest to the fact that that the information above on your After Visit Summary has been reviewed and is understood.  Full responsibility of the confidentiality of this discharge information lies with you and/or your care-partner.

## 2022-07-24 NOTE — Progress Notes (Signed)
To pacu, VSS. Report to [email or web address] 

## 2022-07-25 ENCOUNTER — Telehealth: Payer: Self-pay | Admitting: *Deleted

## 2022-07-25 NOTE — Telephone Encounter (Signed)
  Follow up Call-     07/24/2022    9:55 AM  Call back number  Post procedure Call Back phone  # [phone]  Permission to leave phone message Yes     Patient questions:  Do you have a fever, pain , or abdominal swelling? No. Pain Score  0 *  Have you tolerated food without any problems? Yes.    Have you been able to return to your normal activities? Yes.    Do you have any questions about your discharge instructions: Diet   No. Medications  No. Follow up visit  No.  Do you have questions or concerns about your Care? No.  Actions: * If pain score is 4 or above: No action needed, pain <4.

## 2022-07-28 NOTE — Progress Notes (Signed)
Ms. Carol, Odom news!  All of the biopsies taken throughout your colon were completely normal, with no evidence of dysplasia (precancerous change) or colitis (inflammation).  Your ulcerative colitis is in deep remission.  Please continue to take mesalamine every day.   I recommend a repeat colonoscopy for dysplasia surveillance in 2 years.

## 2022-11-07 ENCOUNTER — Other Ambulatory Visit: Payer: Self-pay | Admitting: Nurse Practitioner

## 2022-11-07 DIAGNOSIS — K219 Gastro-esophageal reflux disease without esophagitis: Secondary | ICD-10-CM

## 2023-02-03 ENCOUNTER — Other Ambulatory Visit: Payer: Self-pay | Admitting: Nurse Practitioner

## 2023-05-05 ENCOUNTER — Telehealth: Payer: Self-pay | Admitting: *Deleted

## 2023-05-05 NOTE — Telephone Encounter (Signed)
LVM for pt to call office to see about rescheduling her appointment that is scheduled on 06/01/23, it is set as a AWV and in the note it was supposed to have fasting labs the week before.  I would like to schedule fasting labs and an office visit for the physical and then reschedule the AWV with the phone team.

## 2023-05-13 ENCOUNTER — Other Ambulatory Visit: Payer: Self-pay

## 2023-05-13 ENCOUNTER — Other Ambulatory Visit: Payer: Self-pay | Admitting: Nurse Practitioner

## 2023-05-13 DIAGNOSIS — Z130 Encounter for screening for diseases of the blood and blood-forming organs and certain disorders involving the immune mechanism: Secondary | ICD-10-CM

## 2023-05-13 DIAGNOSIS — Z1231 Encounter for screening mammogram for malignant neoplasm of breast: Secondary | ICD-10-CM

## 2023-05-13 DIAGNOSIS — Z0000 Encounter for general adult medical examination without abnormal findings: Secondary | ICD-10-CM

## 2023-05-15 ENCOUNTER — Ambulatory Visit
Admission: RE | Admit: 2023-05-15 | Discharge: 2023-05-15 | Disposition: A | Discharge status: Home / Self Care | Payer: Medicare HMO | Source: Ambulatory Visit | Attending: Nurse Practitioner | Admitting: Nurse Practitioner

## 2023-05-15 DIAGNOSIS — Z1231 Encounter for screening mammogram for malignant neoplasm of breast: Secondary | ICD-10-CM | POA: Diagnosis not present

## 2023-05-19 ENCOUNTER — Other Ambulatory Visit: Payer: Self-pay | Admitting: Nurse Practitioner

## 2023-05-25 ENCOUNTER — Other Ambulatory Visit: Payer: Medicare HMO

## 2023-05-25 DIAGNOSIS — Z1321 Encounter for screening for nutritional disorder: Secondary | ICD-10-CM | POA: Diagnosis not present

## 2023-05-25 DIAGNOSIS — Z13228 Encounter for screening for other metabolic disorders: Secondary | ICD-10-CM | POA: Diagnosis not present

## 2023-05-25 DIAGNOSIS — Z0000 Encounter for general adult medical examination without abnormal findings: Secondary | ICD-10-CM

## 2023-05-25 DIAGNOSIS — Z130 Encounter for screening for diseases of the blood and blood-forming organs and certain disorders involving the immune mechanism: Secondary | ICD-10-CM

## 2023-05-25 DIAGNOSIS — Z1329 Encounter for screening for other suspected endocrine disorder: Secondary | ICD-10-CM | POA: Diagnosis not present

## 2023-05-25 DIAGNOSIS — E785 Hyperlipidemia, unspecified: Secondary | ICD-10-CM | POA: Diagnosis not present

## 2023-05-26 LAB — CBC WITH DIFFERENTIAL/PLATELET
Basophils Absolute: 0.1 10*3/uL (ref 0.0–0.2)
Basos: 1 %
EOS (ABSOLUTE): 1 10*3/uL — ABNORMAL HIGH (ref 0.0–0.4)
Eos: 15 %
Hematocrit: 42.4 % (ref 34.0–46.6)
Hemoglobin: 13.8 g/dL (ref 11.1–15.9)
Immature Grans (Abs): 0 10*3/uL (ref 0.0–0.1)
Immature Granulocytes: 0 %
Lymphocytes Absolute: 2.2 10*3/uL (ref 0.7–3.1)
Lymphs: 34 %
MCH: 28.6 pg (ref 26.6–33.0)
MCHC: 32.5 g/dL (ref 31.5–35.7)
MCV: 88 fL (ref 79–97)
Monocytes Absolute: 0.4 10*3/uL (ref 0.1–0.9)
Monocytes: 6 %
Neutrophils Absolute: 2.8 10*3/uL (ref 1.4–7.0)
Neutrophils: 44 %
Platelets: 353 10*3/uL (ref 150–450)
RBC: 4.83 x10E6/uL (ref 3.77–5.28)
RDW: 12.2 % (ref 11.7–15.4)
WBC: 6.5 10*3/uL (ref 3.4–10.8)

## 2023-05-26 LAB — LIPID PANEL
Chol/HDL Ratio: 4.1 ratio (ref 0.0–4.4)
Cholesterol, Total: 278 mg/dL — ABNORMAL HIGH (ref 100–199)
HDL: 67 mg/dL (ref >39)
LDL Chol Calc (NIH): 172 mg/dL — ABNORMAL HIGH (ref 0–99)
Triglycerides: 209 mg/dL — ABNORMAL HIGH (ref 0–149)
VLDL Cholesterol Cal: 39 mg/dL (ref 5–40)

## 2023-05-26 LAB — COMPREHENSIVE METABOLIC PANEL
ALT: 14 IU/L (ref 0–32)
AST: 21 IU/L (ref 0–40)
Albumin: 3.9 g/dL (ref 3.9–4.9)
Alkaline Phosphatase: 111 IU/L (ref 44–121)
BUN/Creatinine Ratio: 13 (ref 12–28)
BUN: 9 mg/dL (ref 8–27)
Bilirubin Total: 0.3 mg/dL (ref 0.0–1.2)
CO2: 22 mmol/L (ref 20–29)
Calcium: 9.5 mg/dL (ref 8.7–10.3)
Chloride: 106 mmol/L (ref 96–106)
Creatinine, Ser: 0.68 mg/dL (ref 0.57–1.00)
Globulin, Total: 2.7 g/dL (ref 1.5–4.5)
Glucose: 87 mg/dL (ref 70–99)
Potassium: 4.3 mmol/L (ref 3.5–5.2)
Sodium: 144 mmol/L (ref 134–144)
Total Protein: 6.6 g/dL (ref 6.0–8.5)
eGFR: 95 mL/min/{1.73_m2} (ref >59)

## 2023-05-26 LAB — HEMOGLOBIN A1C
Est. average glucose Bld gHb Est-mCnc: 111 mg/dL
Hgb A1c MFr Bld: 5.5 % (ref 4.8–5.6)

## 2023-05-26 LAB — TSH: TSH: 0.765 u[IU]/mL (ref 0.450–4.500)

## 2023-06-01 ENCOUNTER — Encounter: Payer: Medicare HMO | Admitting: Nurse Practitioner

## 2023-06-02 ENCOUNTER — Ambulatory Visit (INDEPENDENT_AMBULATORY_CARE_PROVIDER_SITE_OTHER): Payer: Medicare HMO

## 2023-06-02 VITALS — Ht 64.5 in | Wt 158.0 lb

## 2023-06-02 DIAGNOSIS — Z0000 Encounter for general adult medical examination without abnormal findings: Secondary | ICD-10-CM

## 2023-06-02 NOTE — Progress Notes (Signed)
Subjective:   Carol Odom is a 67 y.o. female who presents for Medicare Annual (Subsequent) preventive examination.  Visit Complete: Virtual  I connected with  Carol Odom on 06/02/23 by a audio enabled telemedicine application and verified that I am speaking with the correct person using two identifiers.  Patient Location: Home  Provider Location: Home Office  I discussed the limitations of evaluation and management by telemedicine. The patient expressed understanding and agreed to proceed.  Patient Medicare AWV questionnaire was completed by the patient on 05/30/23; I have confirmed that all information answered by patient is correct and no changes since this date.  Review of Systems     Cardiac Risk Factors include: advanced age (>1men, >73 women)     Objective:    Today's Vitals   06/02/23 1117  Weight: 158 lb (71.7 kg)  Height: 5' 4.5" (1.638 m)   Body mass index is 26.7 kg/m.     06/02/2023   11:24 AM  Advanced Directives  Does Patient Have a Medical Advance Directive? No  Would patient like information on creating a medical advance directive? No - Patient declined    Current Medications (verified) Outpatient Encounter Medications as of 06/02/2023  Medication Sig   acetaminophen (TYLENOL) 325 MG tablet Take by mouth.   albuterol (VENTOLIN HFA) 108 (90 Base) MCG/ACT inhaler INHALE 2 PUFFS BY MOUTH EVERY 6 HOURS AS NEEDED   Ascorbic Acid (VITAMIN C) 1000 MG tablet Take 1,000 mg by mouth daily.   budesonide-formoterol (SYMBICORT) 80-4.5 MCG/ACT inhaler INHALE 2 PUFFS 2 TIMES A DAY   cetirizine (ZYRTEC) 10 MG tablet Take 10 mg by mouth daily.   Cholecalciferol (VITAMIN D) 125 MCG (5000 UT) CAPS Take by mouth.   dicyclomine (BENTYL) 20 MG tablet Take 20-40 mg by mouth every 6 (six) hours as needed.   mesalamine (LIALDA) 1.2 g EC tablet Take by mouth daily with breakfast.   omeprazole (PRILOSEC) 40 MG capsule TAKE 1 CAPSULE BY MOUTH 2 TIMES DAILY.   Zinc 50 MG  TABS Take by mouth.   No facility-administered encounter medications on file as of 06/02/2023.    Allergies (verified) Codeine, Prednisone, Amoxicillin, Augmentin [amoxicillin-pot clavulanate], and Clindamycin/lincomycin   History: Past Medical History:  Diagnosis Date   Asthma    Osteoporosis    Past Surgical History:  Procedure Laterality Date   COLONOSCOPY     NASAL SEPTUM SURGERY     in high school   Family History  Problem Relation Age of Onset   Stroke Mother    High Cholesterol Mother    High Cholesterol Father    Cancer Father    Heart attack Father    High blood pressure Father    Breast cancer Paternal Aunt    Colon cancer Neg Hx    Colon polyps Neg Hx    Esophageal cancer Neg Hx    Rectal cancer Neg Hx    Stomach cancer Neg Hx    Social History   Socioeconomic History   Marital status: Married    Spouse name: Not on file   Number of children: Not on file   Years of education: Not on file   Highest education level: Not on file  Occupational History   Not on file  Tobacco Use   Smoking status: Former   Smokeless tobacco: Never  Vaping Use   Vaping status: Never Used  Substance and Sexual Activity   Alcohol use: Never   Drug use: Never  Sexual activity: Not Currently  Other Topics Concern   Not on file  Social History Narrative   Not on file   Social Determinants of Health   Financial Resource Strain: Low Risk  (06/02/2023)   Overall Financial Resource Strain (CARDIA)    Difficulty of Paying Living Expenses: Not hard at all  Food Insecurity: No Food Insecurity (06/02/2023)   Hunger Vital Sign    Worried About Running Out of Food in the Last Year: Never true    Ran Out of Food in the Last Year: Never true  Transportation Needs: No Transportation Needs (06/02/2023)   PRAPARE - Administrator, Civil Service (Medical): No    Lack of Transportation (Non-Medical): No  Physical Activity: Inactive (06/02/2023)   Exercise Vital Sign     Days of Exercise per Week: 0 days    Minutes of Exercise per Session: 0 min  Stress: No Stress Concern Present (06/02/2023)   [employer] of Occupational Health - Occupational Stress Questionnaire    Feeling of Stress : Not at all  Social Connections: Socially Integrated (06/02/2023)   Social Connection and Isolation Panel [NHANES]    Frequency of Communication with Friends and Family: More than three times a week    Frequency of Social Gatherings with Friends and Family: More than three times a week    Attends Religious Services: More than 4 times per year    Active Member of [employer] or Organizations: Yes    Attends Engineer, structural: More than 4 times per year    Marital Status: Married    Tobacco Counseling Counseling given: Not Answered   Clinical Intake:  Pre-visit preparation completed: Yes  Pain : No/denies pain     BMI - recorded: 26.7 Nutritional Status: BMI 25 -29 Overweight Nutritional Risks: None Diabetes: No  How often do you need to have someone help you when you read instructions, pamphlets, or other written materials from your doctor or pharmacy?: 1 - Never  Interpreter Needed?: No  Information entered by :: Theresa Mulligan LPN   Activities of Daily Living    06/02/2023   11:22 AM 05/30/2023    8:11 PM  In your present state of health, do you have any difficulty performing the following activities:  Hearing? 0 0  Vision? 0 0  Difficulty concentrating or making decisions? 0 0  Walking or climbing stairs? 0 0  Dressing or bathing? 0 0  Doing errands, shopping? 0 0  Preparing Food and eating ? N N  Using the Toilet? N N  In the past six months, have you accidently leaked urine? N N  Do you have problems with loss of bowel control? N N  Managing your Medications? N N  Managing your Finances? N N  Housekeeping or managing your Housekeeping? N N    Patient Care Team: Carlean Jews, NP as PCP - General (Family Medicine)  Indicate  any recent Medical Services you may have received from other than [facility] providers in the past year (date may be approximate).     Assessment:   This is a routine wellness examination for Carol Odom.  Hearing/Vision screen Hearing Screening - Comments:: Denies hearing difficulties   Vision Screening - Comments:: Wears rx glasses - up to date with routine eye exams with  [facility]  Dietary issues and exercise activities discussed:     Goals Addressed               This Visit's  Progress     Stay healthy (pt-stated)        Complete personal projects.       Depression Screen    06/02/2023   11:21 AM 05/28/2022    9:28 AM 04/30/2022    2:00 PM  PHQ 2/9 Scores  PHQ - 2 Score 0 0 0  PHQ- 9 Score  0 1    Fall Risk    06/02/2023   11:22 AM 05/30/2023    8:11 PM 05/24/2022    9:08 AM  Fall Risk   Falls in the past year? 0 0 0  Number falls in past yr: 0    Injury with Fall? 0    Risk for fall due to : No Fall Risks    Follow up Falls prevention discussed      MEDICARE RISK AT HOME:  Medicare Risk at Home - 06/02/23 1128     Any stairs in or around the home? Yes    If so, are there any without handrails? No    Home free of loose throw rugs in walkways, pet beds, electrical cords, etc? Yes    Adequate lighting in your home to reduce risk of falls? Yes    Life alert? No    Use of a cane, walker or w/c? No    Grab bars in the bathroom? No    Shower chair or bench in shower? No    Elevated toilet seat or a handicapped toilet? Yes             TIMED UP AND GO:  Was the test performed?  No    Cognitive Function:        06/02/2023   11:24 AM 05/28/2022    9:28 AM  6CIT Screen  What Year? 0 points 0 points  What month? 0 points   What time? 0 points 0 points  Count back from 20 0 points 0 points  Months in reverse 0 points 0 points  Repeat phrase 0 points 2 points  Total Score 0 points     Immunizations  There is no immunization history on file for this  patient.  TDAP status: Due, Education has been provided regarding the importance of this vaccine. Advised may receive this vaccine at local pharmacy or Health Dept. Aware to provide a copy of the vaccination record if obtained from local pharmacy or Health Dept. Verbalized acceptance and understanding.  Flu Vaccine status: Up to date  Pneumococcal vaccine status: Due, Education has been provided regarding the importance of this vaccine. Advised may receive this vaccine at local pharmacy or Health Dept. Aware to provide a copy of the vaccination record if obtained from local pharmacy or Health Dept. Verbalized acceptance and understanding.  Covid-19 vaccine status: Completed vaccines  Qualifies for Shingles Vaccine? Yes   Zostavax completed No   Shingrix Completed?: No.    Education has been provided regarding the importance of this vaccine. Patient has been advised to call insurance company to determine out of pocket expense if they have not yet received this vaccine. Advised may also receive vaccine at local pharmacy or Health Dept. Verbalized acceptance and understanding.  Screening Tests Health Maintenance  Topic Date Due   Hepatitis C Screening  Never done   DTaP/Tdap/Td (1 - Tdap) Never done   Zoster Vaccines- Shingrix (1 of 2) Never done   Pneumonia Vaccine 40+ Years old (1 of 1 - PCV) Never done   COVID-19 Vaccine (1 - 2023-24 season) Never  done   INFLUENZA VACCINE  06/11/2023   Medicare Annual Wellness (AWV)  06/01/2024   Colonoscopy  07/24/2024   MAMMOGRAM  05/14/2025   DEXA SCAN  Completed   HPV VACCINES  Aged Out    Health Maintenance  Health Maintenance Due  Topic Date Due   Hepatitis C Screening  Never done   DTaP/Tdap/Td (1 - Tdap) Never done   Zoster Vaccines- Shingrix (1 of 2) Never done   Pneumonia Vaccine 68+ Years old (1 of 1 - PCV) Never done   COVID-19 Vaccine (1 - 2023-24 season) Never done    Colorectal cancer screening: Type of screening: Colonoscopy.  Completed 07/24/22. Repeat every 2 years  Mammogram status: Completed 05/15/23. Repeat every year  Bone Density status: Completed 12/10/20. Results reflect: Bone density results: OSTEOPOROSIS. Repeat every   years.  Lung Cancer Screening: (Low Dose CT Chest recommended if Age 9-80 years, 20 pack-year currently smoking OR have quit w/in 15years.) does not qualify.     Additional Screening:  Hepatitis C Screening: does qualify; Deferred  Vision Screening: Recommended annual ophthalmology exams for early detection of glaucoma and other disorders of the eye. Is the patient up to date with their annual eye exam?  Yes  Who is the provider or what is the name of the office in which the patient attends annual eye exams? [facility] If pt is not established with a provider, would they like to be referred to a provider to establish care? No .   Dental Screening: Recommended annual dental exams for proper oral hygiene    Community Resource Referral / Chronic Care Management:  CRR required this visit?  No   CCM required this visit?  No     Plan:     I have personally reviewed and noted the following in the patient's chart:   Medical and social history Use of alcohol, tobacco or illicit drugs  Current medications and supplements including opioid prescriptions. Patient is not currently taking opioid prescriptions. Functional ability and status Nutritional status Physical activity Advanced directives List of other physicians Hospitalizations, surgeries, and ER visits in previous 12 months Vitals Screenings to include cognitive, depression, and falls Referrals and appointments  In addition, I have reviewed and discussed with patient certain preventive protocols, quality metrics, and best practice recommendations. A written personalized care plan for preventive services as well as general preventive health recommendations were provided to patient.     Tillie Rung,  LPN   07/24/7828   After Visit Summary: (MyChart) Due to this being a telephonic visit, the after visit summary with patients personalized plan was offered to patient via MyChart   Nurse Notes: None

## 2023-06-02 NOTE — Patient Instructions (Addendum)
Carol Odom , Thank you for taking time to come for your Medicare Wellness Visit. I appreciate your ongoing commitment to your health goals. Please review the following plan we discussed and let me know if I can assist you in the future.   These are the goals we discussed:  Goals       Stay healthy (pt-stated)      Complete personal projects.        This is a list of the screening recommended for you and due dates:  Health Maintenance  Topic Date Due   Hepatitis C Screening  Never done   DTaP/Tdap/Td vaccine (1 - Tdap) Never done   Zoster (Shingles) Vaccine (1 of 2) Never done   Pneumonia Vaccine (1 of 1 - PCV) Never done   COVID-19 Vaccine (1 - 2023-24 season) Never done   Flu Shot  06/11/2023   Medicare Annual Wellness Visit  06/01/2024   Colon Cancer Screening  07/24/2024   Mammogram  05/14/2025   DEXA scan (bone density measurement)  Completed   HPV Vaccine  Aged Out    Advanced directives: Advance directive discussed with you today. Even though you declined this today, please call our office should you change your mind, and we can give you the proper paperwork for you to fill out.   Conditions/risks identified: None  Next appointment: Follow up in one year for your annual wellness visit    Preventive Care 65 Years and Older, Female Preventive care refers to lifestyle choices and visits with your health care provider that can promote health and wellness. What does preventive care include? A yearly physical exam. This is also called an annual well check. Dental exams once or twice a year. Routine eye exams. Ask your health care provider how often you should have your eyes checked. Personal lifestyle choices, including: Daily care of your teeth and gums. Regular physical activity. Eating a healthy diet. Avoiding tobacco and drug use. Limiting alcohol use. Practicing safe sex. Taking low-dose aspirin every day. Taking vitamin and mineral supplements as recommended by  your health care provider. What happens during an annual well check? The services and screenings done by your health care provider during your annual well check will depend on your age, overall health, lifestyle risk factors, and family history of disease. Counseling  Your health care provider may ask you questions about your: Alcohol use. Tobacco use. Drug use. Emotional well-being. Home and relationship well-being. Sexual activity. Eating habits. History of falls. Memory and ability to understand (cognition). Work and work Astronomer. Reproductive health. Screening  You may have the following tests or measurements: Height, weight, and BMI. Blood pressure. Lipid and cholesterol levels. These may be checked every 5 years, or more frequently if you are over 22 years old. Skin check. Lung cancer screening. You may have this screening every year starting at age 100 if you have a 30-pack-year history of smoking and currently smoke or have quit within the past 15 years. Fecal occult blood test (FOBT) of the stool. You may have this test every year starting at age 80. Flexible sigmoidoscopy or colonoscopy. You may have a sigmoidoscopy every 5 years or a colonoscopy every 10 years starting at age 29. Hepatitis C blood test. Hepatitis B blood test. Sexually transmitted disease (STD) testing. Diabetes screening. This is done by checking your blood sugar (glucose) after you have not eaten for a while (fasting). You may have this done every 1-3 years. Bone density scan. This is done  to screen for osteoporosis. You may have this done starting at age 43. Mammogram. This may be done every 1-2 years. Talk to your health care provider about how often you should have regular mammograms. Talk with your health care provider about your test results, treatment options, and if necessary, the need for more tests. Vaccines  Your health care provider may recommend certain vaccines, such as: Influenza  vaccine. This is recommended every year. Tetanus, diphtheria, and acellular pertussis (Tdap, Td) vaccine. You may need a Td booster every 10 years. Zoster vaccine. You may need this after age 86. Pneumococcal 13-valent conjugate (PCV13) vaccine. One dose is recommended after age 57. Pneumococcal polysaccharide (PPSV23) vaccine. One dose is recommended after age 16. Talk to your health care provider about which screenings and vaccines you need and how often you need them. This information is not intended to replace advice given to you by your health care provider. Make sure you discuss any questions you have with your health care provider. Document Released: 11/23/2015 Document Revised: 07/16/2016 Document Reviewed: 08/28/2015 Elsevier Interactive Patient Education  2017 [employer].  Fall Prevention in the Home Falls can cause injuries. They can happen to people of all ages. There are many things you can do to make your home safe and to help prevent falls. What can I do on the outside of my home? Regularly fix the edges of walkways and driveways and fix any cracks. Remove anything that might make you trip as you walk through a door, such as a raised step or threshold. Trim any bushes or trees on the path to your home. Use bright outdoor lighting. Clear any walking paths of anything that might make someone trip, such as rocks or tools. Regularly check to see if handrails are loose or broken. Make sure that both sides of any steps have handrails. Any raised decks and porches should have guardrails on the edges. Have any leaves, snow, or ice cleared regularly. Use sand or salt on walking paths during winter. Clean up any spills in your garage right away. This includes oil or grease spills. What can I do in the bathroom? Use night lights. Install grab bars by the toilet and in the tub and shower. Do not use towel bars as grab bars. Use non-skid mats or decals in the tub or shower. If you  need to sit down in the shower, use a plastic, non-slip stool. Keep the floor dry. Clean up any water that spills on the floor as soon as it happens. Remove soap buildup in the tub or shower regularly. Attach bath mats securely with double-sided non-slip rug tape. Do not have throw rugs and other things on the floor that can make you trip. What can I do in the bedroom? Use night lights. Make sure that you have a light by your bed that is easy to reach. Do not use any sheets or blankets that are too big for your bed. They should not hang down onto the floor. Have a firm chair that has side arms. You can use this for support while you get dressed. Do not have throw rugs and other things on the floor that can make you trip. What can I do in the kitchen? Clean up any spills right away. Avoid walking on wet floors. Keep items that you use a lot in easy-to-reach places. If you need to reach something above you, use a strong step stool that has a grab bar. Keep electrical cords out of the  way. Do not use floor polish or wax that makes floors slippery. If you must use wax, use non-skid floor wax. Do not have throw rugs and other things on the floor that can make you trip. What can I do with my stairs? Do not leave any items on the stairs. Make sure that there are handrails on both sides of the stairs and use them. Fix handrails that are broken or loose. Make sure that handrails are as long as the stairways. Check any carpeting to make sure that it is firmly attached to the stairs. Fix any carpet that is loose or worn. Avoid having throw rugs at the top or bottom of the stairs. If you do have throw rugs, attach them to the floor with carpet tape. Make sure that you have a light switch at the top of the stairs and the bottom of the stairs. If you do not have them, ask someone to add them for you. What else can I do to help prevent falls? Wear shoes that: Do not have high heels. Have rubber  bottoms. Are comfortable and fit you well. Are closed at the toe. Do not wear sandals. If you use a stepladder: Make sure that it is fully opened. Do not climb a closed stepladder. Make sure that both sides of the stepladder are locked into place. Ask someone to hold it for you, if possible. Clearly mark and make sure that you can see: Any grab bars or handrails. First and last steps. Where the edge of each step is. Use tools that help you move around (mobility aids) if they are needed. These include: Canes. Walkers. Scooters. Crutches. Turn on the lights when you go into a dark area. Replace any light bulbs as soon as they burn out. Set up your furniture so you have a clear path. Avoid moving your furniture around. If any of your floors are uneven, fix them. If there are any pets around you, be aware of where they are. Review your medicines with your doctor. Some medicines can make you feel dizzy. This can increase your chance of falling. Ask your doctor what other things that you can do to help prevent falls. This information is not intended to replace advice given to you by your health care provider. Make sure you discuss any questions you have with your health care provider. Document Released: 08/23/2009 Document Revised: 04/03/2016 Document Reviewed: 12/01/2014 Elsevier Interactive Patient Education  2017 [employer].

## 2023-06-04 ENCOUNTER — Ambulatory Visit (INDEPENDENT_AMBULATORY_CARE_PROVIDER_SITE_OTHER): Payer: Medicare HMO | Admitting: Family Medicine

## 2023-06-04 ENCOUNTER — Encounter: Payer: Self-pay | Admitting: Family Medicine

## 2023-06-04 VITALS — BP 125/76 | HR 83 | Ht 64.5 in | Wt 158.8 lb

## 2023-06-04 DIAGNOSIS — E785 Hyperlipidemia, unspecified: Secondary | ICD-10-CM | POA: Diagnosis not present

## 2023-06-04 MED ORDER — ROSUVASTATIN CALCIUM 5 MG PO TABS
5.0000 mg | ORAL_TABLET | Freq: Every day | ORAL | 1 refills | Status: DC
Start: 2023-06-04 — End: 2023-06-10

## 2023-06-04 NOTE — Progress Notes (Signed)
Established Patient Office Visit  Subjective   Patient ID: Carol Odom, female    DOB: May 09, 1956  Age: 67 y.o. MRN: [id number]  Chief Complaint  Patient presents with   Follow-up    HPI Carol Odom is a 67 y.o. female presenting today for follow up of hyperlipidemia.  She is also concerned about some nodules that have been forming over several months on her palms bilaterally. Hyperlipidemia: Currently consuming a general diet.  She walks for exercise. The 10-year ASCVD risk score (Arnett DK, et al., 2019) is: 7.9%  ROS Negative unless otherwise noted in HPI   Objective:     BP 125/76   Pulse 83   Ht 5' 4.5" (1.638 m)   Wt 158 lb 12 oz (72 kg)   SpO2 99%   BMI 26.83 kg/m   Physical Exam Constitutional:      General: She is not in acute distress.    Appearance: Normal appearance. She is not ill-appearing.  HENT:     Head: Normocephalic and atraumatic.     Right Ear: Tympanic membrane, ear canal and external ear normal.     Left Ear: Tympanic membrane, ear canal and external ear normal.     Nose: Nose normal. No congestion or rhinorrhea.     Mouth/Throat:     Mouth: Mucous membranes are moist.     Pharynx: Oropharynx is clear. No oropharyngeal exudate or posterior oropharyngeal erythema.  Eyes:     General:        Right eye: No discharge.        Left eye: No discharge.     Conjunctiva/sclera: Conjunctivae normal.     Pupils: Pupils are equal, round, and reactive to light.  Cardiovascular:     Rate and Rhythm: Normal rate and regular rhythm.     Heart sounds: No murmur heard.    No friction rub. No gallop.  Pulmonary:     Effort: Pulmonary effort is normal. No respiratory distress.     Breath sounds: Normal breath sounds. No wheezing, rhonchi or rales.  Musculoskeletal:     Comments: 0.5 cm nodules over flexor tendons of the hands bilaterally: Digit to tendon on the left and digit but warm on the right  Skin:    General: Skin is warm and dry.   Neurological:     Mental Status: She is alert and oriented to person, place, and time.      Assessment & Plan:  Hyperlipidemia LDL goal <100 Assessment & Plan: Cholesterol levels have remained elevated for quite some time and she has significant family history for hyperlipidemia.  She is agreeable to starting rosuvastatin 5 mg daily.  We discussed mechanism of action and common side effects.  Return for fasting labs in 2 months to assess efficacy.  Orders: -     CBC with Differential/Platelet; Future -     Lipid panel; Future -     Rosuvastatin Calcium; Take 1 tablet (5 mg total) by mouth daily.  Dispense: 90 tablet; Refill: 1  I reassured her that the nodules on her hands are not dangerous but can form for some people as the tendons of the hands meet friction.  We discussed the option of seeing orthopedics to discuss further management but she feels that it is not impacting her quality of life enough to pursue that option at this time.  She is aware that if at any point in the future she would like to do so, she can  let me know and I can place the referral.  She also noted that she is not currently taking mesalamine but is keeping it on her medication list in case she does have any flares of ulcerative colitis.  Return in about 2 months (around 08/05/2023) for fasting blood work; 6 months for follow up for HLD with fasting labs 1 week before.    Melida Quitter, PA

## 2023-06-04 NOTE — Assessment & Plan Note (Signed)
Cholesterol levels have remained elevated for quite some time and she has significant family history for hyperlipidemia.  She is agreeable to starting rosuvastatin 5 mg daily.  We discussed mechanism of action and common side effects.  Return for fasting labs in 2 months to assess efficacy.

## 2023-06-05 ENCOUNTER — Other Ambulatory Visit: Payer: Self-pay | Admitting: Nurse Practitioner

## 2023-06-09 ENCOUNTER — Telehealth: Payer: Self-pay

## 2023-06-09 NOTE — Telephone Encounter (Signed)
Pt is reporting dizziness and nausea after starting 06/04/23  rosuvastatin (CRESTOR) 5 MG tablet   Pt will discontinue for now until she hears from provider.

## 2023-06-10 ENCOUNTER — Other Ambulatory Visit: Payer: Self-pay | Admitting: Family Medicine

## 2023-06-10 MED ORDER — PRAVASTATIN SODIUM 10 MG PO TABS
10.0000 mg | ORAL_TABLET | Freq: Every day | ORAL | 1 refills | Status: DC
Start: 2023-06-10 — End: 2023-12-07

## 2023-06-10 NOTE — Telephone Encounter (Signed)
Patient can continue to hold medication.  After her symptoms resolve we can try an alternative statin.  Side effects from 1 statin do not often mean that you will have the same side effects from the other statins.  If her symptoms do not resolve after stopping the medication she should be seen by medical provider.

## 2023-06-10 NOTE — Telephone Encounter (Signed)
Pravastatin ordered to replace rosuvastatin after pt experienced dizziness with rosuvastatin.  Symptoms now resolved.

## 2023-07-03 ENCOUNTER — Other Ambulatory Visit: Payer: Self-pay | Admitting: Family Medicine

## 2023-07-14 ENCOUNTER — Ambulatory Visit
Admission: EM | Admit: 2023-07-14 | Discharge: 2023-07-14 | Disposition: A | Discharge status: Home / Self Care | Payer: Medicare HMO

## 2023-07-14 DIAGNOSIS — R062 Wheezing: Secondary | ICD-10-CM

## 2023-07-14 DIAGNOSIS — J4521 Mild intermittent asthma with (acute) exacerbation: Secondary | ICD-10-CM

## 2023-07-14 MED ORDER — ALBUTEROL SULFATE (2.5 MG/3ML) 0.083% IN NEBU: 2.5000 mg | INHALATION_SOLUTION | Freq: Four times a day (QID) | RESPIRATORY_TRACT | 0 refills | Status: AC | PRN

## 2023-07-14 MED ORDER — ALBUTEROL SULFATE (2.5 MG/3ML) 0.083% IN NEBU
2.5000 mg | INHALATION_SOLUTION | Freq: Once | RESPIRATORY_TRACT | Status: AC
  Administered 2023-07-14: 2.5 mg via RESPIRATORY_TRACT

## 2023-07-14 MED ORDER — PREDNISONE 20 MG PO TABS: 40.0000 mg | ORAL_TABLET | Freq: Every day | ORAL | 0 refills | Status: AC

## 2023-07-14 MED ORDER — METHYLPREDNISOLONE ACETATE 80 MG/ML IJ SUSP
80.0000 mg | Freq: Once | INTRAMUSCULAR | Status: AC
  Administered 2023-07-14: 80 mg via INTRAMUSCULAR

## 2023-07-14 NOTE — Discharge Instructions (Signed)
I have prescribed you prednisone to start taking tomorrow and refilled your albuterol nebulizer solution.  Follow-up if any symptoms persist or worsen.

## 2023-07-14 NOTE — ED Provider Notes (Signed)
[facility]    CSN: [id number] Arrival date & time: 07/14/23  1508      History   Chief Complaint Chief Complaint  Patient presents with   Cough   Wheezing    HPI Carol Odom is a 67 y.o. female.   Patient presents today given concern of asthma exacerbation.  Reports that she has had approximately 1 week history of cough and wheezing.  Denies any upper respiratory symptoms or fever.  Reports that season changes typically triggers her asthma but she has not had any issues with asthma flareups in multiple years.  She does take Symbicort daily and has been using albuterol inhaler with this acute flareup with no improvement.  She has a nebulizer machine at home but reports that she no longer has the medication.   Cough Wheezing   Past Medical History:  Diagnosis Date   Asthma    Osteoporosis     Patient Active Problem List   Diagnosis Date Noted   Hyperlipidemia LDL goal <100 05/28/2022   Mild intermittent asthma without complication 04/30/2022   Gastroesophageal reflux disease without esophagitis 04/30/2022   History of ulcerative colitis 04/30/2022    Past Surgical History:  Procedure Laterality Date   COLONOSCOPY     NASAL SEPTUM SURGERY     in high school    OB History   No obstetric history on file.      Home Medications    Prior to Admission medications   Medication Sig Start Date End Date Taking? Authorizing Provider  albuterol (PROVENTIL) (2.5 MG/3ML) 0.083% nebulizer solution Take 3 mLs (2.5 mg total) by nebulization every 6 (six) hours as needed for wheezing or shortness of breath. 07/14/23  Yes , Rolly Salter E, FNP  predniSONE (DELTASONE) 20 MG tablet Take 2 tablets (40 mg total) by mouth daily for 5 days. 07/14/23 07/19/23 Yes , Acie Fredrickson, FNP  acetaminophen (TYLENOL) 325 MG tablet Take by mouth.    [provider]  albuterol (VENTOLIN HFA) 108 (90 Base) MCG/ACT inhaler INHALE 2 PUFFS BY MOUTH EVERY 6 HOURS AS NEEDED 07/06/23    Saralyn Pilar A, PA  Ascorbic Acid (VITAMIN C) 1000 MG tablet Take 1,000 mg by mouth daily.    [provider]  cetirizine (ZYRTEC) 10 MG tablet Take 10 mg by mouth daily.    [provider]  Cholecalciferol (VITAMIN D) 125 MCG (5000 UT) CAPS Take by mouth.    [provider]  Cyanocobalamin (VITAMIN B12) 1000 MCG TBCR 1 tablet Orally Once a day for 30 day(s)    [provider]  dicyclomine (BENTYL) 20 MG tablet Take 20-40 mg by mouth every 6 (six) hours as needed. 03/07/22   [provider]  mesalamine (LIALDA) 1.2 g EC tablet Take by mouth daily with breakfast.    [provider]  omeprazole (PRILOSEC) 40 MG capsule TAKE 1 CAPSULE BY MOUTH 2 TIMES DAILY. 11/07/22   Carlean Jews, NP  pravastatin (PRAVACHOL) 10 MG tablet Take 1 tablet (10 mg total) by mouth daily. 06/10/23   Sandre Kitty, MD  SYMBICORT 80-4.5 MCG/ACT inhaler INHALE 2 PUFFS 2 TIMES A DAY 07/06/23   Saralyn Pilar A, PA  Zinc 50 MG TABS Take by mouth.    [provider]    Family History Family History  Problem Relation Age of Onset   Stroke Mother    High Cholesterol Mother    High Cholesterol Father    Cancer Father  Heart attack Father    High blood pressure Father    Breast cancer Paternal Aunt    Colon cancer Neg Hx    Colon polyps Neg Hx    Esophageal cancer Neg Hx    Rectal cancer Neg Hx    Stomach cancer Neg Hx     Social History Social History   Tobacco Use   Smoking status: Former   Smokeless tobacco: Never  Vaping Use   Vaping status: Never Used  Substance Use Topics   Alcohol use: Never   Drug use: Never     Allergies   Codeine, Prednisone, Amoxicillin, Augmentin [amoxicillin-pot clavulanate], Clindamycin, and Clindamycin/lincomycin   Review of Systems Review of Systems Per HPI  Physical Exam Triage Vital Signs ED Triage Vitals  Encounter Vitals Group     BP 07/14/23 1704 (!) 164/83     Systolic BP Percentile --       Diastolic BP Percentile --      Pulse Rate 07/14/23 1704 89     Resp 07/14/23 1704 (!) 22     Temp 07/14/23 1704 97.9 F (36.6 C)     Temp Source 07/14/23 1704 Oral     SpO2 07/14/23 1704 95 %     Weight --      Height --      Head Circumference --      Peak Flow --      Pain Score 07/14/23 1710 0     Pain Loc --      Pain Education --      Exclude from Growth Chart --    No data found.  Updated Vital Signs BP (!) 164/83 (BP Location: Left Arm)   Pulse 89   Temp 97.9 F (36.6 C) (Oral)   Resp (!) 22   SpO2 95%   Visual Acuity Right Eye Distance:   Left Eye Distance:   Bilateral Distance:    Right Eye Near:   Left Eye Near:    Bilateral Near:     Physical Exam Constitutional:      General: She is not in acute distress.    Appearance: Normal appearance. She is not toxic-appearing or diaphoretic.  HENT:     Head: Normocephalic and atraumatic.  Eyes:     Extraocular Movements: Extraocular movements intact.     Conjunctiva/sclera: Conjunctivae normal.  Cardiovascular:     Rate and Rhythm: Normal rate and regular rhythm.     Pulses: Normal pulses.     Heart sounds: Normal heart sounds.  Pulmonary:     Effort: Pulmonary effort is normal. No respiratory distress.     Breath sounds: Wheezing present.  Neurological:     General: No focal deficit present.     Mental Status: She is alert and oriented to person, place, and time. Mental status is at baseline.  Psychiatric:        Mood and Affect: Mood normal.        Behavior: Behavior normal.        Thought Content: Thought content normal.        Judgment: Judgment normal.      UC Treatments / Results  Labs (all labs ordered are listed, but only abnormal results are displayed) Labs Reviewed - No data to display  EKG   Radiology No results found.  Procedures Procedures (including critical care time)  Medications Ordered in UC Medications  albuterol (PROVENTIL) (2.5 MG/3ML) 0.083% nebulizer  solution 2.5 mg (2.5 mg Nebulization Given 07/14/23  1728)  methylPREDNISolone acetate (DEPO-MEDROL) injection 80 mg (80 mg Intramuscular Given 07/14/23 1728)    Initial Impression / Assessment and Plan / UC Course  I have reviewed the triage vital signs and the nursing notes.  Pertinent labs & imaging results that were available during my care of the patient were reviewed by me and considered in my medical decision making (see chart for details).     Patient appears to be having a mild asthma exacerbation.  Patient does have mild expiratory wheezing but oxygen is normal and no tachypnea so do not think that emergent evaluation is necessary.  IM Depo-Medrol and albuterol nebulizer solution was administered in urgent care with patient reporting improvement in symptoms.  Will prescribe prednisone steroid burst for 5 days for patient to start taking tomorrow and albuterol nebulizer solution was refilled.  Patient has reported allergy notable for nausea and prednisone but patient reports that she is able to tolerate short courses of prednisone.  No obvious contraindications to steroid therapy noted in patient's history and I do think benefits outweigh risks.  Advised strict return and ER precautions.  Patient verbalized understanding and was agreeable with plan. Final Clinical Impressions(s) / UC Diagnoses   Final diagnoses:  Mild intermittent asthma with acute exacerbation  Wheezing     Discharge Instructions      I have prescribed you prednisone to start taking tomorrow and refilled your albuterol nebulizer solution.  Follow-up if any symptoms persist or worsen.     ED Prescriptions     Medication Sig Dispense Auth. Provider   albuterol (PROVENTIL) (2.5 MG/3ML) 0.083% nebulizer solution Take 3 mLs (2.5 mg total) by nebulization every 6 (six) hours as needed for wheezing or shortness of breath. 75 mL , Rolly Salter E, [place]   predniSONE (DELTASONE) 20 MG tablet Take 2 tablets (40 mg total) by  mouth daily for 5 days. 10 tablet Gustavus Bryant, [place]      PDMP not reviewed this encounter.   Gustavus Bryant, [place] 07/14/23 1745

## 2023-07-14 NOTE — ED Triage Notes (Signed)
Pt reports wheezing, cough, congestion and asthma flared up x 1 week. Rescue inhaler gives no reloef.

## 2023-07-21 ENCOUNTER — Ambulatory Visit: Payer: Medicare HMO | Admitting: Family Medicine

## 2023-07-30 DIAGNOSIS — H52223 Regular astigmatism, bilateral: Secondary | ICD-10-CM | POA: Diagnosis not present

## 2023-07-30 DIAGNOSIS — H524 Presbyopia: Secondary | ICD-10-CM | POA: Diagnosis not present

## 2023-07-30 DIAGNOSIS — H5203 Hypermetropia, bilateral: Secondary | ICD-10-CM | POA: Diagnosis not present

## 2023-08-05 ENCOUNTER — Other Ambulatory Visit: Payer: Medicare HMO

## 2023-08-11 ENCOUNTER — Other Ambulatory Visit: Payer: Medicare HMO

## 2023-08-11 DIAGNOSIS — E785 Hyperlipidemia, unspecified: Secondary | ICD-10-CM | POA: Diagnosis not present

## 2023-08-12 LAB — CBC WITH DIFFERENTIAL/PLATELET
Basophils Absolute: 0.1 10*3/uL (ref 0.0–0.2)
Basos: 1 %
EOS (ABSOLUTE): 0.7 10*3/uL — ABNORMAL HIGH (ref 0.0–0.4)
Eos: 14 %
Hematocrit: 41.5 % (ref 34.0–46.6)
Hemoglobin: 13.4 g/dL (ref 11.1–15.9)
Immature Grans (Abs): 0 10*3/uL (ref 0.0–0.1)
Immature Granulocytes: 0 %
Lymphocytes Absolute: 1.7 10*3/uL (ref 0.7–3.1)
Lymphs: 33 %
MCH: 29.7 pg (ref 26.6–33.0)
MCHC: 32.3 g/dL (ref 31.5–35.7)
MCV: 92 fL (ref 79–97)
Monocytes Absolute: 0.3 10*3/uL (ref 0.1–0.9)
Monocytes: 7 %
Neutrophils Absolute: 2.3 10*3/uL (ref 1.4–7.0)
Neutrophils: 45 %
Platelets: 370 10*3/uL (ref 150–450)
RBC: 4.51 x10E6/uL (ref 3.77–5.28)
RDW: 13 % (ref 11.7–15.4)
WBC: 5.1 10*3/uL (ref 3.4–10.8)

## 2023-08-12 LAB — LIPID PANEL
Chol/HDL Ratio: 3 {ratio} (ref 0.0–4.4)
Cholesterol, Total: 233 mg/dL — ABNORMAL HIGH (ref 100–199)
HDL: 78 mg/dL (ref >39)
LDL Chol Calc (NIH): 128 mg/dL — ABNORMAL HIGH (ref 0–99)
Triglycerides: 158 mg/dL — ABNORMAL HIGH (ref 0–149)
VLDL Cholesterol Cal: 27 mg/dL (ref 5–40)

## 2023-08-14 ENCOUNTER — Other Ambulatory Visit: Payer: Self-pay | Admitting: Family Medicine

## 2023-08-14 ENCOUNTER — Other Ambulatory Visit: Payer: Self-pay | Admitting: Nurse Practitioner

## 2023-08-14 DIAGNOSIS — K219 Gastro-esophageal reflux disease without esophagitis: Secondary | ICD-10-CM

## 2023-09-24 ENCOUNTER — Other Ambulatory Visit: Payer: Self-pay | Admitting: Family Medicine

## 2023-10-27 ENCOUNTER — Other Ambulatory Visit: Payer: Self-pay | Admitting: Family Medicine

## 2023-11-24 ENCOUNTER — Other Ambulatory Visit: Payer: Self-pay

## 2023-11-24 DIAGNOSIS — E785 Hyperlipidemia, unspecified: Secondary | ICD-10-CM

## 2023-11-30 ENCOUNTER — Other Ambulatory Visit: Payer: Medicare HMO

## 2023-11-30 DIAGNOSIS — E785 Hyperlipidemia, unspecified: Secondary | ICD-10-CM

## 2023-12-01 ENCOUNTER — Encounter: Payer: Self-pay | Admitting: Family Medicine

## 2023-12-01 ENCOUNTER — Other Ambulatory Visit: Payer: Self-pay | Admitting: Family Medicine

## 2023-12-01 LAB — CBC WITH DIFFERENTIAL/PLATELET
Basophils Absolute: 0.1 10*3/uL (ref 0.0–0.2)
Basos: 1 %
EOS (ABSOLUTE): 0.6 10*3/uL — ABNORMAL HIGH (ref 0.0–0.4)
Eos: 11 %
Hematocrit: 44.2 % (ref 34.0–46.6)
Hemoglobin: 14.4 g/dL (ref 11.1–15.9)
Immature Grans (Abs): 0 10*3/uL (ref 0.0–0.1)
Immature Granulocytes: 0 %
Lymphocytes Absolute: 1.8 10*3/uL (ref 0.7–3.1)
Lymphs: 31 %
MCH: 29 pg (ref 26.6–33.0)
MCHC: 32.6 g/dL (ref 31.5–35.7)
MCV: 89 fL (ref 79–97)
Monocytes Absolute: 0.4 10*3/uL (ref 0.1–0.9)
Monocytes: 6 %
Neutrophils Absolute: 2.9 10*3/uL (ref 1.4–7.0)
Neutrophils: 51 %
Platelets: 390 10*3/uL (ref 150–450)
RBC: 4.96 x10E6/uL (ref 3.77–5.28)
RDW: 11.9 % (ref 11.7–15.4)
WBC: 5.7 10*3/uL (ref 3.4–10.8)

## 2023-12-01 LAB — LIPID PANEL
Chol/HDL Ratio: 3.1 {ratio} (ref 0.0–4.4)
Cholesterol, Total: 234 mg/dL — ABNORMAL HIGH (ref 100–199)
HDL: 76 mg/dL (ref >39)
LDL Chol Calc (NIH): 132 mg/dL — ABNORMAL HIGH (ref 0–99)
Triglycerides: 149 mg/dL (ref 0–149)
VLDL Cholesterol Cal: 26 mg/dL (ref 5–40)

## 2023-12-07 ENCOUNTER — Ambulatory Visit (INDEPENDENT_AMBULATORY_CARE_PROVIDER_SITE_OTHER): Payer: Medicare HMO | Admitting: Family Medicine

## 2023-12-07 ENCOUNTER — Encounter: Payer: Self-pay | Admitting: Family Medicine

## 2023-12-07 VITALS — BP 157/81 | HR 81 | Temp 97.8°F | Ht 64.5 in | Wt 166.0 lb

## 2023-12-07 DIAGNOSIS — R030 Elevated blood-pressure reading, without diagnosis of hypertension: Secondary | ICD-10-CM | POA: Insufficient documentation

## 2023-12-07 DIAGNOSIS — E785 Hyperlipidemia, unspecified: Secondary | ICD-10-CM | POA: Diagnosis not present

## 2023-12-07 DIAGNOSIS — I10 Essential (primary) hypertension: Secondary | ICD-10-CM | POA: Insufficient documentation

## 2023-12-07 MED ORDER — PRAVASTATIN SODIUM 10 MG PO TABS
10.0000 mg | ORAL_TABLET | Freq: Every day | ORAL | 1 refills | Status: DC
Start: 2023-12-07 — End: 2023-12-14

## 2023-12-07 NOTE — Patient Instructions (Addendum)
CHOLESTEROL: Fats Choose healthy fats. These include olive oil and canola oil, flaxseeds, walnuts, almonds, and seeds. Eat more omega-3 fats. These include salmon, mackerel, sardines, tuna, flaxseed oil, and ground flaxseeds. Try to eat fish at least 2 times each week. Check food labels. Avoid foods with trans fats or high amounts of saturated fat. Limit saturated fats. These are often found in animal products, such as meats, butter, and cream. These are also found in plant foods, such as palm oil, palm kernel oil, and coconut oil. Avoid foods with partially hydrogenated oils in them. These have trans fats. Examples are stick margarine, some tub margarines, cookies, crackers, and other baked goods.   PREVENTATIVE CARE: I encourage you to consider the following preventative care options that are recommended for you.  These are covered by your insurance company because they are for the prevention of issues that can be dangerous to your health. -Pneumonia (PCV20) vaccine: Recommended for all adults starting at age 36 to reduce the likelihood of getting pneumonia, having complications, or needing to go to the hospital.  It may be recommended prior to age 17 for people with a weaker immune system due to medications, conditions like diabetes, lung disease, tobacco use, etc. You can have this at the pharmacy or at your primary care office.  Medicare requires these to be given at the pharmacy: -Shingles vaccine: 2 dose series recommended for all adults starting at age 9. -Tetanus (Tdap) booster: Recommended once every 10 years for all adults 62 and older.

## 2023-12-07 NOTE — Assessment & Plan Note (Signed)
Blood pressure elevated in office.  Recommend checking blood pressure at home.  If consistently elevated both at home and in the office, will treat for hypertension.

## 2023-12-07 NOTE — Progress Notes (Signed)
Established Patient Office Visit  Subjective   Patient ID: Carol Odom, female    DOB: 1956/07/30  Age: 68 y.o. MRN: [id number]  Chief Complaint  Patient presents with   Follow-up    HPI Carol Odom is a 69 y.o. female presenting today for follow up of hyperlipidemia. Tolerating pravastatin well with no myalgias or significant side effects. Currently consuming a general diet.  She notes that she watch what she eats at all.  The 10-year ASCVD risk score (Arnett DK, et al., 2019) is: 12.2%   Outpatient Medications Prior to Visit  Medication Sig   acetaminophen (TYLENOL) 325 MG tablet Take by mouth.   albuterol (PROVENTIL) (2.5 MG/3ML) 0.083% nebulizer solution Take 3 mLs (2.5 mg total) by nebulization every 6 (six) hours as needed for wheezing or shortness of breath.   albuterol (VENTOLIN HFA) 108 (90 Base) MCG/ACT inhaler INHALE 2 PUFFS BY MOUTH EVERY 6 HOURS AS NEEDED.   Ascorbic Acid (VITAMIN C) 1000 MG tablet Take 1,000 mg by mouth daily.   cetirizine (ZYRTEC) 10 MG tablet Take 10 mg by mouth daily.   Cholecalciferol (VITAMIN D) 125 MCG (5000 UT) CAPS Take by mouth.   Cyanocobalamin (VITAMIN B12) 1000 MCG TBCR 1 tablet Orally Once a day for 30 day(s)   dicyclomine (BENTYL) 20 MG tablet Take 20-40 mg by mouth every 6 (six) hours as needed.   mesalamine (LIALDA) 1.2 g EC tablet Take by mouth daily with breakfast.   omeprazole (PRILOSEC) 40 MG capsule TAKE 1 CAPSULE BY MOUTH 2 TIMES DAILY.   SYMBICORT 80-4.5 MCG/ACT inhaler INHALE 2 PUFFS 2 TIMES A DAY   Zinc 50 MG TABS Take by mouth.   [DISCONTINUED] pravastatin (PRAVACHOL) 10 MG tablet Take 1 tablet (10 mg total) by mouth daily.   No facility-administered medications prior to visit.    ROS Negative unless otherwise noted in HPI   Objective:     BP (!) 157/81   Pulse 81   Temp 97.8 F (36.6 C) (Temporal)   Ht 5' 4.5" (1.638 m)   Wt 166 lb (75.3 kg)   SpO2 98%   BMI 28.05 kg/m   Physical Exam Constitutional:       General: She is not in acute distress.    Appearance: Normal appearance.  HENT:     Head: Normocephalic and atraumatic.  Cardiovascular:     Rate and Rhythm: Normal rate and regular rhythm.     Heart sounds: No murmur heard.    No friction rub. No gallop.  Pulmonary:     Effort: Pulmonary effort is normal. No respiratory distress.     Breath sounds: No wheezing, rhonchi or rales.  Skin:    General: Skin is warm and dry.  Neurological:     Mental Status: She is alert and oriented to person, place, and time.      Assessment & Plan:  Hyperlipidemia LDL goal <100 Assessment & Plan: Last lipid panel: LDL 132, HDL 76, triglycerides 149, total cholesterol 234. LDL 132 and fairly stable, triglycerides improved slightly to 149.  Started pravastatin 10 mg on 06/10/2023, tolerating well.  Recommend i to continue pravastatin 10 mg daily but start to monitor intake of especially trans and saturated fats.  Recheck cholesterol levels in 6 months, if LDL still above goal recommend increasing pravastatin dose to 20 mg daily.  Will continue to monitor.  Orders: -     Pravastatin Sodium; Take 1 tablet (10 mg total) by mouth daily.  Dispense: 90 tablet; Refill: 1  Elevated blood pressure reading Assessment & Plan: Blood pressure elevated in office.  Recommend checking blood pressure at home.  If consistently elevated both at home and in the office, will treat for hypertension.   Discussed recommendations for updating vaccines.  Patient is aware that she is due for tetanus, discussed that Medicare requires tetanus and shingles vaccines to be administered at the pharmacy.  Patient verbalized understanding.  Return in about 6 months (around 06/05/2024) for follow-up for HLD, fasting labs 1 week before.    Carol Quitter, PA

## 2023-12-07 NOTE — Assessment & Plan Note (Addendum)
Last lipid panel: LDL 132, HDL 76, triglycerides 149, total cholesterol 234. LDL 132 and fairly stable, triglycerides improved slightly to 149.  Started pravastatin 10 mg on 06/10/2023, tolerating well.  Recommend i to continue pravastatin 10 mg daily but start to monitor intake of especially trans and saturated fats.  Recheck cholesterol levels in 6 months, if LDL still above goal recommend increasing pravastatin dose to 20 mg daily.  Will continue to monitor.

## 2023-12-14 ENCOUNTER — Ambulatory Visit (INDEPENDENT_AMBULATORY_CARE_PROVIDER_SITE_OTHER): Payer: Medicare HMO | Admitting: Family Medicine

## 2023-12-14 ENCOUNTER — Ambulatory Visit: Payer: Self-pay | Admitting: Family Medicine

## 2023-12-14 VITALS — BP 168/77 | HR 107 | Ht 64.5 in | Wt 164.8 lb

## 2023-12-14 DIAGNOSIS — I10 Essential (primary) hypertension: Secondary | ICD-10-CM

## 2023-12-14 DIAGNOSIS — E785 Hyperlipidemia, unspecified: Secondary | ICD-10-CM

## 2023-12-14 MED ORDER — VALSARTAN 40 MG PO TABS
40.0000 mg | ORAL_TABLET | Freq: Every day | ORAL | 3 refills | Status: DC
Start: 2023-12-14 — End: 2024-02-11

## 2023-12-14 MED ORDER — PRAVASTATIN SODIUM 20 MG PO TABS: 20.0000 mg | ORAL_TABLET | Freq: Every day | ORAL | 1 refills | Status: DC

## 2023-12-14 NOTE — Assessment & Plan Note (Signed)
Increasing pravastatin to 20 mg.  She will use her current remaining 10 mg tablets by doubling the dose and then pick up new prescription in 1 month.  Recheck after at least 6 weeks of being on new dose.  Goal should be less than 100 LDL.

## 2023-12-14 NOTE — Telephone Encounter (Signed)
Copied from CRM [id number]. Topic: Clinical - Red Word Triage >> Dec 14, 2023 10:57 AM Desma Mcgregor wrote: Red Word that prompted transfer to Nurse Triage: High blood pressure. Friday it was 194/94 and today its 193/87. Patient says she does not feel well but didn't describe her symptoms and wants to be seen today.    Chief Complaint: elevated blood pressure Symptoms: fatigue Frequency: ongoing since Friday Pertinent Negatives: Patient denies shortness of breath, chest pain, vision changes or weakness in extremities  Disposition: [] ED /[] Urgent Care (no appt availability in office) / [x] Appointment(In office/virtual)/ []  [facility]/ [] Home Care/ [] Refused Recommended Disposition /[] [facility]/ []  Follow-up with PCP Additional Notes: The patient has been keeping a log of her blood pressure since 12/08/2023 as her bp was elevated in the office and she was directed to keep a log for 2 weeks.  Friday her blood pressure was 194/94.  She had leftover Lasix from previous leg swelling so she took a dose and drank water.  Saturday her bp was 156/82 in the morning and 122/63.  Sunday it was 153/76 in the morning and 154/70 in the evening.  This morning it was 193/87.  She denied shortness of breath, chest pain, neurological changes.  She was scheduled for a same day appointment for follow up. Reason for Disposition  Systolic BP  >= 180 OR Diastolic >= 110  Answer Assessment - Initial Assessment Questions 1. BLOOD PRESSURE: "What is the blood pressure?" "Did you take at least two measurements 5 minutes apart?"     193/87 2. ONSET: "When did you take your blood pressure?"     10 30 Today  3. HOW: "How did you take your blood pressure?" (e.g., automatic home BP monitor, visiting nurse)     Automatic cuff  4. HISTORY: "Do you have a history of high blood pressure?"     Has been high several times at office Asked her to keep a record for 2 weeks 5. MEDICINES: "Are you taking any medicines  for blood pressure?" "Have you missed any doses recently?"     None  6. OTHER SYMPTOMS: "Do you have any symptoms?" (e.g., blurred vision, chest pain, difficulty breathing, headache, weakness)     Denied blurry vision Fatigue and feels like heart is racing but stressed because it has been so high  Protocols used: Blood Pressure - High-A-AH

## 2023-12-14 NOTE — Assessment & Plan Note (Signed)
Multiple blood pressure readings elevated in the past.  Home readings were all elevated with the exception of 1 reading the day after she took 2 x 1/2 tablets of Lasix.  Creatinine and potassium levels have been normal. - Prescribing valsartan 40 mg today - Continue to monitor blood pressure at home, send in results to [place] in 2 weeks - After 2 weeks increase if needed - Follow-up with me in 1 month.

## 2023-12-14 NOTE — Patient Instructions (Signed)
It was nice to see you today,  We addressed the following topics today: -I have sent any medication called valsartan.  This is for blood pressure.  Take it once a day. - Check your blood pressure 1-2 times a day like you were doing in the past.  Document this for the next 2 weeks.  At that time you can then send [place] a MyChart message with the results or you can bring the paper with the results into the office. - After I have received your home results I will decide if we need to add additional medication or increase the dose of the valsartan. - It is okay to increase your pravastatin to 20 mg by taking 2 of the 10 mg tablets each day.  I will send in a new prescription for pravastatin that should be available to pick up in 1 month.  Have a great day,  Frederic Jericho, MD

## 2023-12-14 NOTE — Progress Notes (Unsigned)
   Acute Office Visit  Subjective:     Patient ID: Carol Odom, female    DOB: 03-29-1956, 68 y.o.   MRN: [id number]  Chief Complaint  Patient presents with   Hypertension    HPI Patient is in today for elevated HTN.  Patient has been taking her blood pressure readings at home.  All of them have been elevated, 2 of them showed readings in the 190s.  After the first reading in the 190s she took half a tablet of an old prescription of Lasix that she had.  Took another half a tablet later on that day.  On the days when her blood pressure is really high she felt like her heart was beating fast and she felt tired and "just not well".  Patient also would like to discuss her cholesterol medicine.  She has been taking pravastatin at the same 10 mg dose since September.  She does not feel like her cholesterol level is improving enough.  We discussed increasing it to 20 mg by doubling the dose until she gets her next refill.    ROS      Objective:    BP (!) 168/77   Pulse (!) 107   Ht 5' 4.5" (1.638 m)   Wt 164 lb 12.8 oz (74.8 kg)   SpO2 96%   BMI 27.85 kg/m  {Vitals History (Optional):23777}  Physical Exam General: Alert, oriented CV: Regular rate rhythm Pulmonary: Lungs clear bilaterally   No results found for any visits on 12/14/23.      Assessment & Plan:   Essential hypertension Assessment & Plan: Multiple blood pressure readings elevated in the past.  Home readings were all elevated with the exception of 1 reading the day after she took 2 x 1/2 tablets of Lasix.  Creatinine and potassium levels have been normal. - Prescribing valsartan 40 mg today - Continue to monitor blood pressure at home, send in results to [place] in 2 weeks - After 2 weeks increase if needed - Follow-up with me in 1 month.   Hyperlipidemia LDL goal <100 Assessment & Plan: Increasing pravastatin to 20 mg.  She will use her current remaining 10 mg tablets by doubling the dose and then pick up  new prescription in 1 month.  Recheck after at least 6 weeks of being on new dose.  Goal should be less than 100 LDL.  Orders: -     Pravastatin Sodium; Take 1 tablet (20 mg total) by mouth daily.  Dispense: 90 tablet; Refill: 1  Other orders -     Valsartan; Take 1 tablet (40 mg total) by mouth daily.  Dispense: 90 tablet; Refill: 3     Return in about 4 weeks (around 01/11/2024) for HTN.  Sandre Kitty, MD

## 2023-12-17 ENCOUNTER — Encounter: Payer: Self-pay | Admitting: Family Medicine

## 2024-01-05 ENCOUNTER — Other Ambulatory Visit: Payer: Self-pay | Admitting: Family Medicine

## 2024-01-12 ENCOUNTER — Ambulatory Visit: Payer: Medicare HMO | Admitting: Family Medicine

## 2024-01-14 ENCOUNTER — Ambulatory Visit: Payer: Medicare HMO | Admitting: Family Medicine

## 2024-01-18 NOTE — Progress Notes (Unsigned)
   Established Patient Office Visit  Subjective   Patient ID: Carol Odom, female    DOB: 07/03/1956  Age: 68 y.o. MRN: [id number]  No chief complaint on file.   HPI  Htn - valsartan 40mg    The 10-year ASCVD risk score (Arnett DK, et al., 2019) is: 18.4%  Health Maintenance Due  Topic Date Due   Pneumonia Vaccine 64+ Years old (1 of 2 - PCV) Never done   Hepatitis C Screening  Never done   DTaP/Tdap/Td (1 - Tdap) Never done   Zoster Vaccines- Shingrix (1 of 2) Never done   COVID-19 Vaccine (1 - 2024-25 season) Never done      Objective:     There were no vitals taken for this visit. {Vitals History (Optional):23777}  Physical Exam   No results found for any visits on 01/19/24.      Assessment & Plan:   There are no diagnoses linked to this encounter.   No follow-ups on file.    Sandre Kitty, MD

## 2024-01-19 ENCOUNTER — Ambulatory Visit (INDEPENDENT_AMBULATORY_CARE_PROVIDER_SITE_OTHER): Admitting: Family Medicine

## 2024-01-19 ENCOUNTER — Encounter: Payer: Self-pay | Admitting: Family Medicine

## 2024-01-19 VITALS — BP 147/71 | HR 91 | Ht 64.5 in | Wt 165.1 lb

## 2024-01-19 DIAGNOSIS — I10 Essential (primary) hypertension: Secondary | ICD-10-CM

## 2024-01-19 DIAGNOSIS — E785 Hyperlipidemia, unspecified: Secondary | ICD-10-CM | POA: Diagnosis not present

## 2024-01-19 NOTE — Patient Instructions (Signed)
 It was nice to see you today,  We addressed the following topics today: -We are going to increase your valsartan to 80 mg.  If you feel like like you are not tolerating this dose very well you can take 40 mg twice a day.  If that is not an option we can always go back to 40 mg of valsartan and add a second medication - We will also recheck your cholesterol level today.  You should have a pravastatin dose already sent in from our last visit. - Please record your blood pressure at home twice a day in the morning in the evening and then bring it back to [place] after you have recorded 2 weeks of blood pressure results on your higher dose.  Have a great day,  Frederic Jericho, MD

## 2024-01-19 NOTE — Assessment & Plan Note (Signed)
 Still above goal but better today.  Checking CMP and increasing dose to 80 mg.  Will have patient check her blood pressure at home and bring results to [place].

## 2024-01-19 NOTE — Assessment & Plan Note (Signed)
 5 weeks of the increased pravastatin dose so far.  Will recheck today.

## 2024-01-20 ENCOUNTER — Encounter: Payer: Self-pay | Admitting: Family Medicine

## 2024-01-20 LAB — COMPREHENSIVE METABOLIC PANEL
ALT: 15 IU/L (ref 0–32)
AST: 17 IU/L (ref 0–40)
Albumin: 3.7 g/dL — ABNORMAL LOW (ref 3.9–4.9)
Alkaline Phosphatase: 156 IU/L — ABNORMAL HIGH (ref 44–121)
BUN/Creatinine Ratio: 9 — ABNORMAL LOW (ref 12–28)
BUN: 5 mg/dL — ABNORMAL LOW (ref 8–27)
Bilirubin Total: <0.2 mg/dL (ref 0.0–1.2)
CO2: 24 mmol/L (ref 20–29)
Calcium: 9.3 mg/dL (ref 8.7–10.3)
Chloride: 100 mmol/L (ref 96–106)
Creatinine, Ser: 0.58 mg/dL (ref 0.57–1.00)
Globulin, Total: 3.2 g/dL (ref 1.5–4.5)
Glucose: 95 mg/dL (ref 70–99)
Potassium: 3.8 mmol/L (ref 3.5–5.2)
Sodium: 143 mmol/L (ref 134–144)
Total Protein: 6.9 g/dL (ref 6.0–8.5)
eGFR: 98 mL/min/{1.73_m2} (ref >59)

## 2024-01-20 LAB — LIPID PANEL
Chol/HDL Ratio: 3 ratio (ref 0.0–4.4)
Cholesterol, Total: 180 mg/dL (ref 100–199)
HDL: 60 mg/dL (ref >39)
LDL Chol Calc (NIH): 88 mg/dL (ref 0–99)
Triglycerides: 189 mg/dL — ABNORMAL HIGH (ref 0–149)
VLDL Cholesterol Cal: 32 mg/dL (ref 5–40)

## 2024-02-10 ENCOUNTER — Other Ambulatory Visit: Payer: Self-pay | Admitting: Family Medicine

## 2024-02-10 ENCOUNTER — Telehealth: Payer: Self-pay | Admitting: Family Medicine

## 2024-02-10 NOTE — Telephone Encounter (Signed)
 Please call the patient and let her know that her blood pressure readings at home are still elevated,.  I would like to change her blood pressure medication from valsartan to a pill that contains the same dose of valsartan but also adds hydrochlorothiazide.  If she is agreeable to this I can send it in.

## 2024-02-11 ENCOUNTER — Other Ambulatory Visit: Payer: Self-pay | Admitting: Family Medicine

## 2024-02-11 MED ORDER — VALSARTAN-HYDROCHLOROTHIAZIDE 80-12.5 MG PO TABS
1.0000 | ORAL_TABLET | Freq: Every day | ORAL | 3 refills | Status: DC
Start: 2024-02-11 — End: 2024-07-06

## 2024-02-17 ENCOUNTER — Ambulatory Visit: Payer: Self-pay

## 2024-02-17 DIAGNOSIS — I10 Essential (primary) hypertension: Secondary | ICD-10-CM

## 2024-02-17 NOTE — Addendum Note (Signed)
 Addended by: Sandre Kitty on: 02/17/2024 11:12 AM   Modules accepted: Orders

## 2024-02-17 NOTE — Telephone Encounter (Signed)
 Please call the patient and let her know to continue taking the blood pressure medication and continue checking her blood pressure especially if she feels any dizziness.  Her blood pressure readings she provided are not concerning by themselves, but if they are causing symptoms we may need to adjust the medicine.  We would likely adjust it by switching the medicine to a lower dose and taking it twice a day.   She should let us know if the top number gets below 100 or bottom number gets below 60.    She can reschedule an appointment with me sooner as well. June is fine. I would also like her to schedule a lab visit next week to recheck her potassium and kidney function.

## 2024-02-17 NOTE — Telephone Encounter (Signed)
 Copied from CRM [id number]. Topic: Clinical - Medical Advice >> Feb 17, 2024  9:36 AM Carol Odom wrote: Reason for CRM: Patient concerned that the new blood pressure medication is causing blood pressure to be too low. Patient reports that her blood pressure reading yesterday after taking the medication was 114/62, 111/61, 107/63. Patient also would like to know if the follow up appointment should be in June instead of July. Cb# [phone]  Chief Complaint: Lower than normal BP Symptoms: Dizziness last night Frequency: Yesterday and today Pertinent Negatives: Patient denies symptoms at this time Disposition: [] ED /[] Urgent Care (no appt availability in office) / [] Appointment(In office/virtual)/ []  [facility]/ [x] Home Care/ [] Refused Recommended Disposition /[] [facility]/ [x]  Follow-up with PCP Additional Notes: Patient called in to report low BP following a recent BP medication change. Patient started taking valsartan-hydrochlorothiazide on 02/12/24. Patient reported readings of 143/69 and 129/65 this morning and 122/64 and 107/63 yesterday. Patient stated she felt a brief period of dizziness last night. Patient denied dizziness and additional symptoms at this time. Patient inquired if she should take her BP medication today. This RN advised patient to take BP medication as prescribed, until otherwise notified by provider. Provided care advice and instructed patient to call back if systolic BP reaches 80-90s or symptoms persist. Patient complied. Patient would like additional input from provider on if additional medication adjustment is necessary. Please advise.   Reason for Disposition  [1] Systolic BP 90-110 AND [2] taking blood pressure medications AND [3] NOT dizzy, lightheaded or weak  Answer Assessment - Initial Assessment Questions 1. BLOOD PRESSURE: "What is the blood pressure?" "Did you take at least two measurements 5 minutes apart?"     143/69 this morning, 129/65 this  morning, 122/64 yesterday morning, 107/63 yesterday evening 2. ONSET: "When did you take your blood pressure?"     Yesterday and today 3. HOW: "How did you obtain the blood pressure?" (e.g., visiting nurse, automatic home BP monitor)     At home 4. HISTORY: "Do you have a history of low blood pressure?" "What is your blood pressure normally?"     States she has a history of high BP and recently had a medication change 5. MEDICINES: "Are you taking any medications for blood pressure?" If Yes, ask: "Have they been changed recently?"     Valsartan-hydrochlorothiazide started on 02/12/24 6. PULSE RATE: "Do you know what your pulse rate is?"      102 last night 7. OTHER SYMPTOMS: "Have you been sick recently?" "Have you had a recent injury?"     States she felt dizzy last night, denies dizziness at this time  Protocols used: Blood Pressure - Low-A-AH

## 2024-02-22 ENCOUNTER — Other Ambulatory Visit: Payer: Self-pay | Admitting: *Deleted

## 2024-02-22 DIAGNOSIS — D721 Eosinophilia, unspecified: Secondary | ICD-10-CM

## 2024-02-22 DIAGNOSIS — E785 Hyperlipidemia, unspecified: Secondary | ICD-10-CM

## 2024-02-22 DIAGNOSIS — I10 Essential (primary) hypertension: Secondary | ICD-10-CM

## 2024-02-24 ENCOUNTER — Other Ambulatory Visit

## 2024-02-24 DIAGNOSIS — D721 Eosinophilia, unspecified: Secondary | ICD-10-CM

## 2024-02-24 DIAGNOSIS — I10 Essential (primary) hypertension: Secondary | ICD-10-CM

## 2024-02-24 DIAGNOSIS — E785 Hyperlipidemia, unspecified: Secondary | ICD-10-CM

## 2024-02-25 LAB — CBC WITH DIFFERENTIAL/PLATELET
Basophils Absolute: 0.1 10*3/uL (ref 0.0–0.2)
Basos: 2 %
EOS (ABSOLUTE): 0.6 10*3/uL — ABNORMAL HIGH (ref 0.0–0.4)
Eos: 11 %
Hematocrit: 41 % (ref 34.0–46.6)
Hemoglobin: 13.7 g/dL (ref 11.1–15.9)
Immature Grans (Abs): 0 10*3/uL (ref 0.0–0.1)
Immature Granulocytes: 0 %
Lymphocytes Absolute: 2.3 10*3/uL (ref 0.7–3.1)
Lymphs: 42 %
MCH: 29.8 pg (ref 26.6–33.0)
MCHC: 33.4 g/dL (ref 31.5–35.7)
MCV: 89 fL (ref 79–97)
Monocytes Absolute: 0.4 10*3/uL (ref 0.1–0.9)
Monocytes: 7 %
Neutrophils Absolute: 2.1 10*3/uL (ref 1.4–7.0)
Neutrophils: 38 %
Platelets: 391 10*3/uL (ref 150–450)
RBC: 4.59 x10E6/uL (ref 3.77–5.28)
RDW: 13.1 % (ref 11.7–15.4)
WBC: 5.4 10*3/uL (ref 3.4–10.8)

## 2024-02-25 LAB — BASIC METABOLIC PANEL WITH GFR
BUN/Creatinine Ratio: 16 (ref 12–28)
BUN: 11 mg/dL (ref 8–27)
CO2: 25 mmol/L (ref 20–29)
Calcium: 9.6 mg/dL (ref 8.7–10.3)
Chloride: 99 mmol/L (ref 96–106)
Creatinine, Ser: 0.69 mg/dL (ref 0.57–1.00)
Glucose: 87 mg/dL (ref 70–99)
Potassium: 3.9 mmol/L (ref 3.5–5.2)
Sodium: 140 mmol/L (ref 134–144)
eGFR: 94 mL/min/{1.73_m2} (ref >59)

## 2024-02-25 LAB — LIPID PANEL
Chol/HDL Ratio: 3.3 ratio (ref 0.0–4.4)
Cholesterol, Total: 251 mg/dL — ABNORMAL HIGH (ref 100–199)
HDL: 76 mg/dL (ref >39)
LDL Chol Calc (NIH): 143 mg/dL — ABNORMAL HIGH (ref 0–99)
Triglycerides: 182 mg/dL — ABNORMAL HIGH (ref 0–149)
VLDL Cholesterol Cal: 32 mg/dL (ref 5–40)

## 2024-02-28 ENCOUNTER — Encounter: Payer: Self-pay | Admitting: Family Medicine

## 2024-02-29 ENCOUNTER — Other Ambulatory Visit: Payer: Self-pay | Admitting: Family Medicine

## 2024-03-01 NOTE — Telephone Encounter (Signed)
 The pt states she is still taking her cholesterol medication faithfully and she is not having any issues. She also states she has been cutting her blood pressure medication pills in half and taking half in the am and the other half in pm, instead of taking a whole one during the morning

## 2024-03-08 ENCOUNTER — Other Ambulatory Visit: Payer: Self-pay | Admitting: Family Medicine

## 2024-03-08 DIAGNOSIS — K219 Gastro-esophageal reflux disease without esophagitis: Secondary | ICD-10-CM

## 2024-03-08 MED ORDER — ATORVASTATIN CALCIUM 20 MG PO TABS
ORAL_TABLET | ORAL | 1 refills | Status: DC
Start: 2024-03-08 — End: 2024-07-06

## 2024-03-08 NOTE — Telephone Encounter (Signed)
 Please let the patient know I am sending in a prescription for Lipitor instead of the pravastatin .  She did not tolerate Crestor  in the past so I will not send that in, but Lipitor is a more effective cholesterol medication than pravastatin .  I would like her to take a half a tablet for the first 4 weeks, then increase to a full tablet.

## 2024-03-09 ENCOUNTER — Other Ambulatory Visit: Payer: Self-pay | Admitting: Family Medicine

## 2024-03-09 NOTE — Telephone Encounter (Signed)
 Pt informed of below.

## 2024-04-20 ENCOUNTER — Ambulatory Visit: Admitting: Family Medicine

## 2024-05-06 ENCOUNTER — Ambulatory Visit (INDEPENDENT_AMBULATORY_CARE_PROVIDER_SITE_OTHER): Admitting: Family Medicine

## 2024-05-06 ENCOUNTER — Encounter: Payer: Self-pay | Admitting: Family Medicine

## 2024-05-06 VITALS — BP 114/65 | HR 89 | Ht 64.5 in | Wt 170.8 lb

## 2024-05-06 DIAGNOSIS — E785 Hyperlipidemia, unspecified: Secondary | ICD-10-CM

## 2024-05-06 DIAGNOSIS — I10 Essential (primary) hypertension: Secondary | ICD-10-CM

## 2024-05-06 DIAGNOSIS — R519 Headache, unspecified: Secondary | ICD-10-CM | POA: Diagnosis not present

## 2024-05-06 DIAGNOSIS — E663 Overweight: Secondary | ICD-10-CM | POA: Insufficient documentation

## 2024-05-06 NOTE — Assessment & Plan Note (Signed)
 Hyperlipidemia on Lipitor with dose-related muscle aches. Previously tolerated half tablet without recalled muscle symptoms. - Reduce Lipitor from 20mg  to 10mg  daily - Recheck cholesterol in 6-8 weeks - Continue reduced dose for full 8 weeks before assessing efficacy - Message via MyChart regarding muscle ache resolution

## 2024-05-06 NOTE — Progress Notes (Signed)
 Established Patient Office Visit  Subjective   Patient ID: Carol Odom, female    DOB: Apr 15, 1956  Age: 68 y.o. MRN: [id number]  Chief Complaint  Patient presents with   Medical Management of Chronic Issues    HPI Subjective - Follow-up for blood pressure management - Reports weakness and feeling like somebody lets the air out of you after starting Valsartan /HCTZ combination - Episodes occur suddenly, requiring sitting down, causing nervousness - Initially took whole tablet in morning which was awful - Now splits dose: half morning, half evening with improvement - Checks blood pressure during episodes, typically reads 107/55 - No recent high blood pressure readings at home - Muscle weakness with Lipitor, particularly with overhead activities - Arms get weak more quickly than before - Headaches 3-4 times per week, various locations (frontal, temporal) - Avoiding most medications except Tylenol due to blood pressure concerns - Weight gain concerns, reports being the fattest I've ever been - Stopped exercising due to fear of blood pressure complications - Previously exercised daily  Medications: Valsartan /HCTZ (splitting dose, half morning/half evening), Lipitor 20mg  (experiencing muscle aches), cholesterol medication taken in evening, blood pressure medication in morning.  PMHx: Hypertension, hyperlipidemia. PSH: Not mentioned. FH: Not mentioned. Social Hx: Avoids caffeine except one caffeine-free diet soda with breakfast, previously exercised daily.  ROS: Reports muscle weakness with overhead activities, headaches 3-4 times weekly without nausea or visual changes, denies high blood pressure readings at home recently.   The 10-year ASCVD risk score (Arnett DK, et al., 2019) is: 9.1%  Health Maintenance Due  Topic Date Due   Hepatitis C Screening  Never done   DTaP/Tdap/Td (1 - Tdap) Never done   COVID-19 Vaccine (1 - 2024-25 season) Never done   Medicare Annual  Wellness (AWV)  06/01/2024   Colonoscopy  07/24/2024      Objective:     BP 114/65   Pulse 89   Ht 5' 4.5 (1.638 m)   Wt 170 lb 12.8 oz (77.5 kg)   SpO2 99%   BMI 28.86 kg/m    Physical Exam Gen: alert, oriented Pulm: no resp distress Psych: pleasant affect   No results found for any visits on 05/06/24.      Assessment & Plan:   Essential hypertension Assessment & Plan: Hypertension with symptomatic hypotension on Valsartan /HCTZ combination. Reports significant improvement with dose splitting from once daily to twice daily dosing. Blood pressure episodes correlate with readings around 107/55. - Continue current regimen of half tablet morning, half tablet evening - Home blood pressure monitoring daily for couple weeks with log - Proper technique counseled: sit 5 minutes, legs uncrossed on floor, arm at heart level - Take first reading, discard, wait 30 seconds, record second reading - Follow-up in 2 months   Hyperlipidemia LDL goal <100 Assessment & Plan: Hyperlipidemia on Lipitor with dose-related muscle aches. Previously tolerated half tablet without recalled muscle symptoms. - Reduce Lipitor from 20mg  to 10mg  daily - Recheck cholesterol in 6-8 weeks - Continue reduced dose for full 8 weeks before assessing efficacy - Message via MyChart regarding muscle ache resolution   Nonintractable episodic headache, unspecified headache type Assessment & Plan: Headaches occurring 3-4 times weekly, various locations, no associated nausea or visual changes. - Magnesium oxide 400mg  daily recommended - Alternative: Migrelief supplement or riboflavin 400mg  plus magnesium 400mg  daily - Tylenol 1000mg  every 8 hours as needed (maximum 3 grams daily) - Consider Flonase nasal spray if sinus-related   Overweight Assessment & Plan: - Counseled that  exercise raises blood pressure during activity but lowers it overall - Avoid heavy weightlifting/powerlifting - Walking and  jogging safe and beneficial - Calorie management primary factor in weight control      Return in about 2 months (around 07/06/2024) for physical.    [name], MD

## 2024-05-06 NOTE — Patient Instructions (Addendum)
 It was nice to see you today,  We addressed the following topics today: -Continue having your blood pressure medicine in half and taking half a tablet in the morning and half a tablet in the evening - Check your blood pressure daily and record the value even when you are not having symptomatic episodes.  You can check your blood pressure, wait 30 seconds and then check it again and write down the second number.  This should be more accurate - For migraine relief for headache relief you can try taking magnesium oxide 400 mg daily.  There is another over-the-counter supplement called Migrelief that contains this plus riboflavin.  You can also take it 1000 mg Tylenol every 8 hours - Cut your cholesterol medication in half and take that until I see you again and we can discuss if you are still having muscle aching.  Have a great day,  Rolan Slain, MD

## 2024-05-06 NOTE — Assessment & Plan Note (Signed)
 Hypertension with symptomatic hypotension on Valsartan /HCTZ combination. Reports significant improvement with dose splitting from once daily to twice daily dosing. Blood pressure episodes correlate with readings around 107/55. - Continue current regimen of half tablet morning, half tablet evening - Home blood pressure monitoring daily for couple weeks with log - Proper technique counseled: sit 5 minutes, legs uncrossed on floor, arm at heart level - Take first reading, discard, wait 30 seconds, record second reading - Follow-up in 2 months

## 2024-05-06 NOTE — Assessment & Plan Note (Signed)
 Headaches occurring 3-4 times weekly, various locations, no associated nausea or visual changes. - Magnesium oxide 400mg  daily recommended - Alternative: Migrelief supplement or riboflavin 400mg  plus magnesium 400mg  daily - Tylenol 1000mg  every 8 hours as needed (maximum 3 grams daily) - Consider Flonase nasal spray if sinus-related

## 2024-05-06 NOTE — Assessment & Plan Note (Signed)
-   Counseled that exercise raises blood pressure during activity but lowers it overall - Avoid heavy weightlifting/powerlifting - Walking and jogging safe and beneficial - Calorie management primary factor in weight control

## 2024-05-31 ENCOUNTER — Other Ambulatory Visit: Payer: Medicare HMO

## 2024-06-02 ENCOUNTER — Ambulatory Visit: Payer: Medicare HMO

## 2024-06-02 DIAGNOSIS — Z0000 Encounter for general adult medical examination without abnormal findings: Secondary | ICD-10-CM

## 2024-06-02 NOTE — Patient Instructions (Signed)
 Carol Odom , Thank you for taking time out of your busy schedule to complete your Annual Wellness Visit with me. I enjoyed our conversation and look forward to speaking with you again next year. I, as well as your care team,  appreciate your ongoing commitment to your health goals. Please review the following plan we discussed and let me know if I can assist you in the future. Your Game plan/ To Do List    Referrals: If you haven't heard from the office you've been referred to, please reach out to them at the phone provided.  N/a Follow up Visits: Next Medicare AWV with our clinical staff: 08/10/2025 at 1:00   Have you seen your provider in the last 6 months (3 months if uncontrolled diabetes)? Yes Next Office Visit with your provider: 07/06/2024 at 11:10  Clinician Recommendations:  Aim for 30 minutes of exercise or brisk walking, 6-8 glasses of water, and 5 servings of fruits and vegetables each day.       This is a list of the screening recommended for you and due dates:  Health Maintenance  Topic Date Due   Hepatitis C Screening  Never done   DTaP/Tdap/Td vaccine (1 - Tdap) Never done   COVID-19 Vaccine (1 - 2024-25 season) Never done   Colon Cancer Screening  07/24/2024   Zoster (Shingles) Vaccine (1 of 2) 08/06/2024*   Pneumococcal Vaccine for age over 103 (1 of 2 - PCV) 05/06/2025*   Flu Shot  06/10/2024   Mammogram  05/14/2025   Medicare Annual Wellness Visit  06/02/2025   DEXA scan (bone density measurement)  Completed   Hepatitis B Vaccine  Aged Out   HPV Vaccine  Aged Out   Meningitis B Vaccine  Aged Out  *Topic was postponed. The date shown is not the original due date.    Advanced directives: (ACP Link)Information on Advanced Care Planning can be found at [place]  Secretary of [facility] Advance Health Care Directives Advance Health Care Directives. [email or web address]  Advance Care Planning is important because it:  [x]  Makes sure you receive the medical care that is consistent  with your values, goals, and preferences  [x]  It provides guidance to your family and loved ones and reduces their decisional burden about whether or not they are making the right decisions based on your wishes.  Follow the link provided in your after visit summary or read over the paperwork we have mailed to you to help you started getting your Advance Directives in place. If you need assistance in completing these, please reach out to us  so that we can help you!  See attachments for Preventive Care and Fall Prevention Tips.

## 2024-06-02 NOTE — Progress Notes (Signed)
 Subjective:   Carol Odom is a 68 y.o. who presents for a Medicare Wellness preventive visit.  As a reminder, Annual Wellness Visits don't include a physical exam, and some assessments may be limited, especially if this visit is performed virtually. We may recommend an in-person follow-up visit with your provider if needed.  Visit Complete: Virtual I connected with  Carol Odom Bring on 06/02/24 by a audio enabled telemedicine application and verified that I am speaking with the correct person using two identifiers.  Patient Location: Home  Provider Location: Home Office  I discussed the limitations of evaluation and management by telemedicine. The patient expressed understanding and agreed to proceed.  Vital Signs: Because this visit was a virtual/telehealth visit, some criteria may be missing or patient reported. Any vitals not documented were not able to be obtained and vitals that have been documented are patient reported.  VideoError- Librarian, academic were attempted between this provider and patient, however failed, due to patient having technical difficulties OR patient did not have access to video capability.  We continued and completed visit with audio only.   Persons Participating in Visit: Patient.  AWV Questionnaire: Yes: Patient Medicare AWV questionnaire was completed by the patient on 05/29/2024; I have confirmed that all information answered by patient is correct and no changes since this date.  Cardiac Risk Factors include: advanced age (>67men, >39 women);dyslipidemia;hypertension     Objective:    Today's Vitals   There is no height or weight on file to calculate BMI.     06/02/2024    1:00 PM 06/02/2023   11:24 AM  Advanced Directives  Does Patient Have a Medical Advance Directive? No No  Would patient like information on creating a medical advance directive? No - Patient declined No - Patient declined    Current Medications  (verified) Outpatient Encounter Medications as of 06/02/2024  Medication Sig   acetaminophen (TYLENOL) 325 MG tablet Take by mouth.   albuterol  (PROVENTIL ) (2.5 MG/3ML) 0.083% nebulizer solution Take 3 mLs (2.5 mg total) by nebulization every 6 (six) hours as needed for wheezing or shortness of breath.   albuterol  (VENTOLIN  HFA) 108 (90 Base) MCG/ACT inhaler INHALE 2 PUFFS BY MOUTH EVERY 6 HOURS AS NEEDED.   Ascorbic Acid (VITAMIN C) 1000 MG tablet Take 1,000 mg by mouth daily.   atorvastatin  (LIPITOR) 20 MG tablet Take 1/2 tablet for the first 4 weeks, then increase to full tablet   cetirizine (ZYRTEC) 10 MG tablet Take 10 mg by mouth daily.   Cholecalciferol (VITAMIN D) 125 MCG (5000 UT) CAPS Take by mouth.   Cyanocobalamin (VITAMIN B12) 1000 MCG TBCR 1 tablet Orally Once a day for 30 day(s)   dicyclomine (BENTYL) 20 MG tablet Take 20-40 mg by mouth every 6 (six) hours as needed.   mesalamine (LIALDA) 1.2 g EC tablet Take by mouth daily with breakfast.   omeprazole  (PRILOSEC) 40 MG capsule TAKE 1 CAPSULE BY MOUTH 2 TIMES DAILY.   SYMBICORT 80-4.5 MCG/ACT inhaler INHALE 2 PUFFS 2 TIMES A DAY   valsartan -hydrochlorothiazide  (DIOVAN -HCT) 80-12.5 MG tablet Take 1 tablet by mouth daily.   Zinc 50 MG TABS Take by mouth.   No facility-administered encounter medications on file as of 06/02/2024.    Allergies (verified) Codeine, Prednisone , Amoxicillin, Augmentin [amoxicillin-pot clavulanate], Clindamycin, and Clindamycin/lincomycin   History: Past Medical History:  Diagnosis Date   Asthma    Osteoporosis    Past Surgical History:  Procedure Laterality Date  COLONOSCOPY     NASAL SEPTUM SURGERY     in high school   Family History  Problem Relation Age of Onset   Stroke Mother    High Cholesterol Mother    High Cholesterol Father    Cancer Father    Heart attack Father    High blood pressure Father    Breast cancer Paternal Aunt    Colon cancer Neg Hx    Colon polyps Neg Hx     Esophageal cancer Neg Hx    Rectal cancer Neg Hx    Stomach cancer Neg Hx    Social History   Socioeconomic History   Marital status: Married    Spouse name: Not on file   Number of children: Not on file   Years of education: Not on file   Highest education level: Some college, no degree  Occupational History   Not on file  Tobacco Use   Smoking status: Former   Smokeless tobacco: Never  Vaping Use   Vaping status: Never Used  Substance and Sexual Activity   Alcohol use: Never   Drug use: Never   Sexual activity: Not Currently  Other Topics Concern   Not on file  Social History Narrative   Not on file   Social Drivers of Health   Financial Resource Strain: Low Risk  (06/02/2024)   Overall Financial Resource Strain (CARDIA)    Difficulty of Paying Living Expenses: Not hard at all  Food Insecurity: No Food Insecurity (06/02/2024)   Hunger Vital Sign    Worried About Running Out of Food in the Last Year: Never true    Ran Out of Food in the Last Year: Never true  Transportation Needs: No Transportation Needs (06/02/2024)   PRAPARE - Administrator, Civil Service (Medical): No    Lack of Transportation (Non-Medical): No  Physical Activity: Inactive (06/02/2024)   Exercise Vital Sign    Days of Exercise per Week: 0 days    Minutes of Exercise per Session: 0 min  Stress: No Stress Concern Present (06/02/2024)   [employer] of Occupational Health - Occupational Stress Questionnaire    Feeling of Stress: Not at all  Social Connections: Moderately Integrated (06/02/2024)   Social Connection and Isolation Panel    Frequency of Communication with Friends and Family: More than three times a week    Frequency of Social Gatherings with Friends and Family: Twice a week    Attends Religious Services: 1 to 4 times per year    Active Member of [employer] or Organizations: No    Attends Engineer, structural: Never    Marital Status: Married    Tobacco  Counseling Counseling given: Not Answered    Clinical Intake:  Pre-visit preparation completed: Yes  Pain : No/denies pain     Nutritional Risks: None Diabetes: No  Lab Results  Component Value Date   HGBA1C 5.5 05/25/2023   HGBA1C 5.6 05/21/2022     How often do you need to have someone help you when you read instructions, pamphlets, or other written materials from your doctor or pharmacy?: 1 - Never  Interpreter Needed?: No  Information entered by :: NAllen LPN   Activities of Daily Living     05/29/2024    6:29 PM  In your present state of health, do you have any difficulty performing the following activities:  Hearing? 0  Vision? 0  Difficulty concentrating or making decisions? 0  Walking or climbing stairs?  0  Dressing or bathing? 0  Doing errands, shopping? 0  Preparing Food and eating ? N  Using the Toilet? N  In the past six months, have you accidently leaked urine? N  Do you have problems with loss of bowel control? N  Managing your Medications? N  Managing your Finances? N  Housekeeping or managing your Housekeeping? N    Patient Care Team: Chandra Toribio POUR, MD as PCP - General (Family Medicine)  I have updated your Care Teams any recent Medical Services you may have received from other providers in the past year.     Assessment:   This is a routine wellness examination for Jonique.  Hearing/Vision screen Hearing Screening - Comments:: Denies hearing issues Vision Screening - Comments:: Regular eye exams, [facility]   Goals Addressed             This Visit's Progress    Patient Stated       06/02/2024, wants to watch diet       Depression Screen     06/02/2024    1:01 PM 05/06/2024    9:02 AM 01/19/2024   10:01 AM 12/07/2023   10:00 AM 06/04/2023    1:21 PM 06/02/2023   11:21 AM 05/28/2022    9:28 AM  PHQ 2/9 Scores  PHQ - 2 Score 0 0 0 0 0 0 0  PHQ- 9 Score 1 2 0 1 4  0    Fall Risk     05/29/2024    6:29 PM 12/07/2023   10:00  AM 06/04/2023    1:20 PM 06/02/2023   11:22 AM 05/30/2023    8:11 PM  Fall Risk   Falls in the past year? 0 0 0 0 0  Number falls in past yr: 0 0 0 0   Injury with Fall? 0 0 0 0   Risk for fall due to : Medication side effect No Fall Risks No Fall Risks No Fall Risks   Follow up Falls prevention discussed;Falls evaluation completed Falls evaluation completed Falls evaluation completed Falls prevention discussed     MEDICARE RISK AT HOME:  Medicare Risk at Home Any stairs in or around the home?: (Patient-Rptd) Yes If so, are there any without handrails?: (Patient-Rptd) No Home free of loose throw rugs in walkways, pet beds, electrical cords, etc?: (Patient-Rptd) Yes Adequate lighting in your home to reduce risk of falls?: (Patient-Rptd) Yes Life alert?: (Patient-Rptd) No Use of a cane, walker or w/c?: (Patient-Rptd) No Grab bars in the bathroom?: (Patient-Rptd) No Shower chair or bench in shower?: (Patient-Rptd) No Elevated toilet seat or a handicapped toilet?: (Patient-Rptd) Yes  TIMED UP AND GO:  Was the test performed?  No  Cognitive Function: 6CIT completed        06/02/2024    1:03 PM 06/02/2023   11:24 AM 05/28/2022    9:28 AM  6CIT Screen  What Year? 0 points 0 points 0 points  What month? 0 points 0 points   What time? 0 points 0 points 0 points  Count back from 20 0 points 0 points 0 points  Months in reverse 0 points 0 points 0 points  Repeat phrase 2 points 0 points 2 points  Total Score 2 points 0 points     Immunizations  There is no immunization history on file for this patient.  Screening Tests Health Maintenance  Topic Date Due   Hepatitis C Screening  Never done   DTaP/Tdap/Td (1 - Tdap) Never done  COVID-19 Vaccine (1 - 2024-25 season) Never done   Colonoscopy  07/24/2024   Zoster Vaccines- Shingrix (1 of 2) 08/06/2024 (Originally 08/26/2004)   Pneumococcal Vaccine: 50+ Years (1 of 2 - PCV) 05/06/2025 (Originally 08/26/1973)   INFLUENZA VACCINE   06/10/2024   MAMMOGRAM  05/14/2025   Medicare Annual Wellness (AWV)  06/02/2025   DEXA SCAN  Completed   Hepatitis B Vaccines  Aged Out   HPV VACCINES  Aged Out   Meningococcal B Vaccine  Aged Out    Health Maintenance  Health Maintenance Due  Topic Date Due   Hepatitis C Screening  Never done   DTaP/Tdap/Td (1 - Tdap) Never done   COVID-19 Vaccine (1 - 2024-25 season) Never done   Colonoscopy  07/24/2024   Health Maintenance Items Addressed: Declines vaccines except due for TDAP.   Additional Screening:  Vision Screening: Recommended annual ophthalmology exams for early detection of glaucoma and other disorders of the eye. Would you like a referral to an eye doctor? No    Dental Screening: Recommended annual dental exams for proper oral hygiene  Community Resource Referral / Chronic Care Management: CRR required this visit?  No   CCM required this visit?  No   Plan:    I have personally reviewed and noted the following in the patient's chart:   Medical and social history Use of alcohol, tobacco or illicit drugs  Current medications and supplements including opioid prescriptions. Patient is not currently taking opioid prescriptions. Functional ability and status Nutritional status Physical activity Advanced directives List of other physicians Hospitalizations, surgeries, and ER visits in previous 12 months Vitals Screenings to include cognitive, depression, and falls Referrals and appointments  In addition, I have reviewed and discussed with patient certain preventive protocols, quality metrics, and best practice recommendations. A written personalized care plan for preventive services as well as general preventive health recommendations were provided to patient.   Ardella FORBES Dawn, LPN   2/75/7974   After Visit Summary: (MyChart) Due to this being a telephonic visit, the after visit summary with patients personalized plan was offered to patient via MyChart    Notes: Nothing significant to report at this time.

## 2024-06-06 ENCOUNTER — Ambulatory Visit: Payer: Medicare HMO | Admitting: Family Medicine

## 2024-06-06 ENCOUNTER — Ambulatory Visit: Admitting: Family Medicine

## 2024-06-28 ENCOUNTER — Other Ambulatory Visit: Payer: Self-pay | Admitting: *Deleted

## 2024-06-28 ENCOUNTER — Other Ambulatory Visit

## 2024-06-28 DIAGNOSIS — I10 Essential (primary) hypertension: Secondary | ICD-10-CM

## 2024-06-28 DIAGNOSIS — Z131 Encounter for screening for diabetes mellitus: Secondary | ICD-10-CM | POA: Diagnosis not present

## 2024-06-28 DIAGNOSIS — E785 Hyperlipidemia, unspecified: Secondary | ICD-10-CM

## 2024-06-29 ENCOUNTER — Other Ambulatory Visit: Payer: Self-pay | Admitting: Family Medicine

## 2024-06-29 ENCOUNTER — Ambulatory Visit: Payer: Self-pay | Admitting: Family Medicine

## 2024-06-29 LAB — COMPREHENSIVE METABOLIC PANEL WITH GFR
ALT: 26 IU/L (ref 0–32)
AST: 28 IU/L (ref 0–40)
Albumin: 4.1 g/dL (ref 3.9–4.9)
Alkaline Phosphatase: 116 IU/L (ref 44–121)
BUN/Creatinine Ratio: 14 (ref 12–28)
BUN: 13 mg/dL (ref 8–27)
Bilirubin Total: 0.3 mg/dL (ref 0.0–1.2)
CO2: 25 mmol/L (ref 20–29)
Calcium: 9.7 mg/dL (ref 8.7–10.3)
Chloride: 100 mmol/L (ref 96–106)
Creatinine, Ser: 0.9 mg/dL (ref 0.57–1.00)
Globulin, Total: 2.9 g/dL (ref 1.5–4.5)
Glucose: 93 mg/dL (ref 70–99)
Potassium: 3.9 mmol/L (ref 3.5–5.2)
Sodium: 141 mmol/L (ref 134–144)
Total Protein: 7 g/dL (ref 6.0–8.5)
eGFR: 69 mL/min/1.73 (ref >59)

## 2024-06-29 LAB — LIPID PANEL
Chol/HDL Ratio: 3.2 ratio (ref 0.0–4.4)
Cholesterol, Total: 219 mg/dL — ABNORMAL HIGH (ref 100–199)
HDL: 69 mg/dL (ref >39)
LDL Chol Calc (NIH): 111 mg/dL — ABNORMAL HIGH (ref 0–99)
Triglycerides: 230 mg/dL — ABNORMAL HIGH (ref 0–149)
VLDL Cholesterol Cal: 39 mg/dL (ref 5–40)

## 2024-06-29 LAB — HEMOGLOBIN A1C
Est. average glucose Bld gHb Est-mCnc: 123 mg/dL
Hgb A1c MFr Bld: 5.9 % — ABNORMAL HIGH (ref 4.8–5.6)

## 2024-06-29 LAB — TSH: TSH: 1.07 u[IU]/mL (ref 0.450–4.500)

## 2024-07-06 ENCOUNTER — Ambulatory Visit (INDEPENDENT_AMBULATORY_CARE_PROVIDER_SITE_OTHER): Admitting: Family Medicine

## 2024-07-06 ENCOUNTER — Encounter: Payer: Self-pay | Admitting: Family Medicine

## 2024-07-06 VITALS — BP 105/62 | HR 82 | Ht 64.5 in | Wt 173.4 lb

## 2024-07-06 DIAGNOSIS — E785 Hyperlipidemia, unspecified: Secondary | ICD-10-CM

## 2024-07-06 DIAGNOSIS — M720 Palmar fascial fibromatosis [Dupuytren]: Secondary | ICD-10-CM | POA: Diagnosis not present

## 2024-07-06 DIAGNOSIS — I10 Essential (primary) hypertension: Secondary | ICD-10-CM

## 2024-07-06 DIAGNOSIS — Z0000 Encounter for general adult medical examination without abnormal findings: Secondary | ICD-10-CM | POA: Diagnosis not present

## 2024-07-06 MED ORDER — PRAVASTATIN SODIUM 20 MG PO TABS: 20.0000 mg | ORAL_TABLET | Freq: Every day | ORAL | 1 refills | Status: DC

## 2024-07-06 MED ORDER — VALSARTAN 80 MG PO TABS: 80.0000 mg | ORAL_TABLET | Freq: Every day | ORAL | 3 refills | Status: DC

## 2024-07-06 MED ORDER — AMLODIPINE BESYLATE 5 MG PO TABS: 5.0000 mg | ORAL_TABLET | Freq: Every day | ORAL | 3 refills | Status: DC

## 2024-07-06 NOTE — Assessment & Plan Note (Signed)
 Patient is due for several preventative care items. - Recommended tetanus vaccination. - Patient declines shingles and pneumococcal vaccines at this time. - Patient will self-schedule colonoscopy at a later date. - Discussed benefits of continued low-impact exercise and dietary modifications.

## 2024-07-06 NOTE — Patient Instructions (Signed)
 It was nice to see you today,  We addressed the following topics today: -I would like you to stop the valsartan -HCTZ. - I have sent in valsartan  by itself.  Take it half a tablet twice a day just like you have been doing - I have added amlodipine  as well.  Hold off on taking the amlodipine , but if you check your blood pressure and the readings are in the 140s or higher consistently start taking amlodipine . - If the amlodipine  dropped your blood pressure too much you can take a half a tablet. - Instead of the atorvastatin  I am sending in pravastatin  which you tolerated better.  You can start by taking a half a tablet for 2 weeks and then increasing it to a full tablet.   Have a great day,  Rolan Slain, MD

## 2024-07-06 NOTE — Progress Notes (Signed)
 Annual physical  Subjective    Patient ID: Carol Odom, female    DOB: 1956/09/30  Age: 68 y.o. MRN: [id number]  Chief Complaint  Patient presents with   Annual Exam   HPI Carol Odom is a 68 y.o. old female here  for annual exam.   Subjective - Annual physical. Reports no new issues or concerns.  - Hyperlipidemia: Lab results show cholesterol is improved but remains elevated. Experiences muscle aches with atorvastatin  10 mg daily (recently reduced dose). Previously tried Crestor  (caused dizziness) and pravastatin . Pravastatin  was well-tolerated but cholesterol increased after starting hydrochlorothiazide .  - Hypertension: Currently taking valsartan -hydrochlorothiazide , splitting the 80 mg tablet. Reports episodes of low blood pressure (e.g., 105/62) with associated dizziness and mild nausea. BP was previously in the 150s-160s before HCTZ was added. Has a new home blood pressure cuff.  - Hand nodules: Reports palpable knots in the palms of both hands, present for a while. Notices the hand shape is changing and one finger seems to be deviating sideways. Denies fingers curling inward or locking.  - Health Maintenance: Due for tetanus vaccination. Declines shingles and pneumococcal vaccines at this time. Plans to schedule a colonoscopy later.  - Lifestyle: Started a low-impact exercise regimen, 30 minutes daily. Reports getting heart rate up and sometimes feeling short of breath with exertion. Trying to increase intake of fruits and vegetables. Is not a significant meat eater.  Medications Current medications include atorvastatin  10 mg daily, valsartan -hydrochlorothiazide  80 mg (taken as half a tablet twice daily). Prior medications include Crestor  and pravastatin .  PMH, PSH, FH, Social Hx PMHx: Hyperlipidemia, hypertension, prediabetes.  ROS Constitutional: Denies fever, chills. Cardiovascular: Denies chest pain. Reports symptomatic hypotension with dizziness and  nausea. Musculoskeletal: Reports muscle aches. Reports nodules on palms. Denies finger locking.    The 10-year ASCVD risk score (Arnett DK, et al., 2019) is: 7.6%  Health Maintenance Due  Topic Date Due   Hepatitis C Screening  Never done   COVID-19 Vaccine (1 - 2024-25 season) Never done   INFLUENZA VACCINE  06/10/2024      Objective:     BP 105/62   Pulse 82   Ht 5' 4.5 (1.638 m)   Wt 173 lb 6.4 oz (78.7 kg)   SpO2 97%   BMI 29.30 kg/m    Physical Exam General: Alert, oriented HEENT: PERRLA, EOMI, moist mucous membrane CV: Regular rate and rhythm no murmurs Pulmonary: Clear bilaterally GI: Soft, normal bowel sounds MSK: Strength equal bilaterally. Extremities: No pedal edema.  Palmar contractures both hands. Psych: Pleasant affect   No results found for any visits on 07/06/24.      Assessment & Plan:   Hyperlipidemia LDL goal <100 Assessment & Plan: Lab work shows persistent hyperlipidemia despite treatment with atorvastatin , which is causing muscle aches. A1C is in the prediabetic range at 5.9, which may be exacerbated by hydrochlorothiazide . Previous trials of Crestor  caused dizziness. Tolerated pravastatin  well in the past, but ldl incrased above goal after sstarting hctz. - Stop atorvastatin . - Start pravastatin . Take half a tablet for two weeks, then increase to a full tablet daily. - Recheck lipid panel in 6-8 weeks.   Essential hypertension Assessment & Plan: Blood pressure is currently low and symptomatic on valsartan -HCTZ. Pt concerned The HCTZ may also be contributing to elevated cholesterol and A1C. - Stop valsartan -HCTZ. - Start valsartan  80 mg. Continue to take half a tablet twice daily. - Prescribed amlodipine . Instructed to hold this medication and start taking it only if home  blood pressure readings are consistently 140s or higher. - If amlodipine  is started and causes blood pressure to become too low, may cut the tablet and take a half  dose. - Instructed to monitor blood pressure at home in the morning and evening for two weeks after medication changes and bring the log to the next appointment.   Dupuytren contracture Assessment & Plan: Examination reveals palpable nodules in the palms consistent with Dupuytren's contracture. Currently no functional limitation or locking. - Plan is conservative management and monitoring. - Counseled that if it becomes bothersome or functional impairment develops, orthopedic evaluation for injections or procedural intervention can be considered.   Healthcare maintenance Assessment & Plan: Patient is due for several preventative care items. - Recommended tetanus vaccination. - Patient declines shingles and pneumococcal vaccines at this time. - Patient will self-schedule colonoscopy at a later date. - Discussed benefits of continued low-impact exercise and dietary modifications.   Other orders -     Valsartan ; Take 1 tablet (80 mg total) by mouth daily.  Dispense: 90 tablet; Refill: 3 -     amLODIPine  Besylate; Take 1 tablet (5 mg total) by mouth daily.  Dispense: 90 tablet; Refill: 3 -     Pravastatin  Sodium; Take 1 tablet (20 mg total) by mouth daily.  Dispense: 90 tablet; Refill: 1     Return in about 4 months (around 11/05/2024) for HTN, hld.    [name], MD

## 2024-07-06 NOTE — Assessment & Plan Note (Signed)
 Blood pressure is currently low and symptomatic on valsartan -HCTZ. Pt concerned The HCTZ may also be contributing to elevated cholesterol and A1C. - Stop valsartan -HCTZ. - Start valsartan  80 mg. Continue to take half a tablet twice daily. - Prescribed amlodipine . Instructed to hold this medication and start taking it only if home blood pressure readings are consistently 140s or higher. - If amlodipine  is started and causes blood pressure to become too low, may cut the tablet and take a half dose. - Instructed to monitor blood pressure at home in the morning and evening for two weeks after medication changes and bring the log to the next appointment.

## 2024-07-06 NOTE — Assessment & Plan Note (Signed)
 Lab work shows persistent hyperlipidemia despite treatment with atorvastatin , which is causing muscle aches. A1C is in the prediabetic range at 5.9, which may be exacerbated by hydrochlorothiazide . Previous trials of Crestor  caused dizziness. Tolerated pravastatin  well in the past, but ldl incrased above goal after sstarting hctz. - Stop atorvastatin . - Start pravastatin . Take half a tablet for two weeks, then increase to a full tablet daily. - Recheck lipid panel in 6-8 weeks.

## 2024-07-06 NOTE — Assessment & Plan Note (Signed)
 Examination reveals palpable nodules in the palms consistent with Dupuytren's contracture. Currently no functional limitation or locking. - Plan is conservative management and monitoring. - Counseled that if it becomes bothersome or functional impairment develops, orthopedic evaluation for injections or procedural intervention can be considered.

## 2024-07-19 ENCOUNTER — Ambulatory Visit: Payer: Self-pay

## 2024-07-19 ENCOUNTER — Ambulatory Visit (INDEPENDENT_AMBULATORY_CARE_PROVIDER_SITE_OTHER)

## 2024-07-19 VITALS — BP 148/78 | HR 95 | Resp 99 | Wt 177.0 lb

## 2024-07-19 DIAGNOSIS — R600 Localized edema: Secondary | ICD-10-CM | POA: Diagnosis not present

## 2024-07-19 DIAGNOSIS — J4520 Mild intermittent asthma, uncomplicated: Secondary | ICD-10-CM | POA: Diagnosis not present

## 2024-07-19 DIAGNOSIS — E785 Hyperlipidemia, unspecified: Secondary | ICD-10-CM

## 2024-07-19 DIAGNOSIS — I10 Essential (primary) hypertension: Secondary | ICD-10-CM | POA: Diagnosis not present

## 2024-07-19 MED ORDER — MONTELUKAST SODIUM 10 MG PO TABS: 10.0000 mg | ORAL_TABLET | Freq: Every day | ORAL | 2 refills | Status: DC

## 2024-07-19 MED ORDER — SPIRONOLACTONE 25 MG PO TABS: 25.0000 mg | ORAL_TABLET | Freq: Every day | ORAL | 2 refills | Status: DC

## 2024-07-19 NOTE — Telephone Encounter (Signed)
 FYI Only or Action Required?: FYI only for provider.  Patient was last seen in primary care on 07/06/2024 by Chandra Toribio POUR, MD.  Called Nurse Triage reporting Leg Swelling.  Symptoms began several days ago.  Interventions attempted: Nothing.  Symptoms are: unchanged.  Triage Disposition: See Physician Within 24 Hours  Patient/caregiver understands and will follow disposition?: Yes   Copied from CRM #[id number]. Topic: Clinical - Red Word Triage >> Jul 19, 2024  9:06 AM Everette C wrote: [facility] that prompted transfer to Nurse Triage: The patient is currently experiencing swelling in their legs and ankles and their BP is currently 150/70 checked recently Reason for Disposition  [1] MODERATE leg swelling (e.g., swelling extends up to knees) AND [2] new-onset or getting worse  Answer Assessment - Initial Assessment Questions 1. ONSET: When did the swelling start? (e.g., minutes, hours, days)     Four days ago 2. LOCATION: What part of the leg is swollen?  Are both legs swollen or just one leg?     Both legs, from the top of foot to knees 3. SEVERITY: How bad is the swelling? (e.g., localized; mild, moderate, severe)     Moderate swelling, describes pitting edema 4. REDNESS: Is there redness or signs of infection?     Red splotches above ankles 5. PAIN: Is the swelling painful to touch? If Yes, ask: How painful is it?   (Scale 1-10; mild, moderate or severe)     Describes as uncomfortable 6. FEVER: Do you have a fever? If Yes, ask: What is it, how was it measured, and when did it start?      denies 7. CAUSE: What do you think is causing the leg swelling?     Unsure, is concerned for prevastatin that she picked up 13 days ago labeled as prevastatin sodium 8. MEDICAL HISTORY: Do you have a history of blood clots (e.g., DVT), cancer, heart failure, kidney disease, or liver failure?     denies 9. RECURRENT SYMPTOM: Have you had leg swelling before? If Yes, ask:  When was the last time? What happened that time?    Reports that years ago, she would have leg swelling with her period 50. OTHER SYMPTOMS: Do you have any other symptoms? (e.g., chest pain, difficulty breathing)      No chest pain, patient with history of asthma 11. PREGNANCY: Is there any chance you are pregnant? When was your last menstrual period?       N/A  Protocols used: Leg Swelling and Edema-A-AH

## 2024-07-19 NOTE — Patient Instructions (Addendum)
 VISIT SUMMARY: During today's visit, we discussed your recent lower leg swelling, changes in your medications, and your asthma symptoms. We also reviewed your general health maintenance and provided recommendations to help manage your conditions.  YOUR PLAN: -ASTHMA WITH ACUTE EXACERBATION AND ALLERGIC RHINITIS: You are experiencing worsening asthma symptoms and seasonal allergies. We have prescribed montelukast  to help manage these symptoms. Continue using Symbicort twice daily and albuterol  as needed. Monitor your response to montelukast  and let us  know if your symptoms persist.  -LOWER EXTREMITY EDEMA: The swelling in your legs is likely due to stopping hydrochlorothiazide . We have prescribed spironolactone  to help reduce the fluid retention. Please wear knee-high compression socks and elevate your legs to help reduce the swelling.  -ECCHYMOSIS OF LOWER EXTREMITY: The rash on your leg is likely due to a small blood vessel rupture from the skin stretching because of the swelling. It should resolve as the swelling decreases. Monitor the rash for any changes in color.  -HYPERTENSION: Your blood pressure is well-managed with valsartan . Continue monitoring your blood pressure twice daily. We may adjust your valsartan  dosage if needed.  -HYPERLIPIDEMIA: We have switched your cholesterol medication from atorvastatin  to pravastatin  to help manage your cholesterol levels.  -GENERAL HEALTH MAINTENANCE: We discussed the importance of managing your cholesterol and blood pressure to prevent complications. Please use MyChart for communication with your healthcare providers.  INSTRUCTIONS: Please follow up with us  if your symptoms persist or worsen. Continue monitoring your blood pressure twice daily and keep track of any changes. Use MyChart to communicate any concerns or questions you may have.  If you have any problems before your next visit feel free to message me via MyChart (minor issues or questions)  or call the office, otherwise you may reach out to schedule an office visit.  Thank you! Saddie Sacks, PA-C

## 2024-07-19 NOTE — Assessment & Plan Note (Addendum)
 BP Goal <130/80. Above goal today in office and on repeat. Per patient's blood pressure readings from home, BP well within goal at 110's-120's.  -Continue Valsartan  80 mg (half tablet twice daily).  - Start spironolactone  12.5-25 mg daily to support BP and reduce lower leg swelling  - Cont to avoid hydrochlorothiazide  due to increased triglyceride levels  - Recommend holding amlodipine  for now too due to potential s/e of LLE. If additional BP agent is needed, recommend against BB's due to asthma hx and recent exacerbation.  - Instructed to monitor blood pressure at home in AM and PM for the next two weeks and notify via MyChart of the results.  - Need to monitor potassium levels at her lab appt in October

## 2024-07-19 NOTE — Assessment & Plan Note (Signed)
 Discontinued atorvastatin  2 weeks ago due to muscle aches and triglycerides not improving. Restarted on pravastatin , which she has been taking and is tolerating well. Continue pravastatin  for now with plans to recheck lipids in 6-8 weeks per Dr. Chandra.

## 2024-07-19 NOTE — Assessment & Plan Note (Addendum)
 Swelling likely due to discontinuation of hydrochlorothiazide . No signs of deep vein thrombosis. - Prescribed spironolactone  to manage fluid retention. Advised to start with half tablet (12.5 mg) daily and can increase to full tablet (25 mg) if blood pressure supports it and swelling is not fully resolved at half-tablet dose.  - Advised wearing knee-high compression socks. - Recommended elevating legs when possible to reduce swelling -Petechial rash likely likely due to blood vessel rupture from skin stretching secondary to edema. Expected to resolve as swelling decreases. Will cont to monitor.

## 2024-07-19 NOTE — Assessment & Plan Note (Signed)
 Experiencing shortness of breath and wheezing despite current treatment. Seasonal allergies exacerbate symptoms. Current treatment not providing adequate relief. - Prescribed montelukast  to manage symptoms, noted potential side effect of vivid dreams. - Continue Symbicort twice daily and albuterol  as needed. - Monitor response to montelukast , consider chest imaging if symptoms persist.

## 2024-07-19 NOTE — Progress Notes (Signed)
 Acute Office Visit  Subjective:     Patient ID: Carol Odom, female    DOB: Apr 07, 1956, 68 y.o.   MRN: [id number]  Chief Complaint  Patient presents with   Follow-up    Patient in room # 5 and alone. Pt states every night she has bialteral leg swelling. Pt states her blood pressure was up this morning and her asthma been bothering her.    HPI  History of Present Illness   Carol Odom is a 68 year old female with hypertension who presents with lower leg swelling.  Lower extremity edema and rash - Lower leg swelling began Friday after a day of extensive shopping - Swelling initially subsided by Saturday morning but recurred after working in the garage/being on her feet for extended periods of time - Edema is significant, affecting knees and lower legs - Associated with a non-pruritic, red, flattened rash that appeared recently - No pain associated with the swelling. Denies redness or heat in the legs.  Antihypertensive medication changes - Hydrochlorothiazide  discontinued two weeks ago due to concern for elevated triglycerides despite being on statin therapy - Amlodipine  prescribed as replacement but not yet started due to stable blood pressure at home - Currently taking valsartan  alone for management of blood pressure  Dyslipidemia medication adjustment - Pravastatin  recently substituted for atorvastatin  2 weeks ago. She is tolerating medication well and denies side effects.  Asthma and respiratory symptoms - History of asthma - Uses rescue albuterol  inhaler and Symbicort twice daily - Rescue inhaler recently less effective, requiring albuterol  nebulizer treatment last night for relief - Experiencing shortness of breath and wheezing on exertion, attributed to asthma and seasonal allergies  Tobacco use history - Quit smoking over twenty years ago after a long history of tobacco use      ROS Per HPI     Objective:    BP (!) 148/78   Pulse 95   Resp (!) 99    Wt 177 lb (80.3 kg)   BMI 29.91 kg/m    Physical Exam Constitutional:      General: She is not in acute distress.    Appearance: Normal appearance.  Cardiovascular:     Rate and Rhythm: Normal rate and regular rhythm.     Heart sounds: Normal heart sounds. No murmur heard.    No friction rub. No gallop.  Pulmonary:     Effort: Pulmonary effort is normal. No respiratory distress.     Breath sounds: Normal breath sounds.  Musculoskeletal:     Right lower leg: 1+ Edema present.     Left lower leg: 1+ Edema present.     Comments: Mild, red, non-blanching petechial rash noted to both ankles  Skin:    General: Skin is warm and dry.  Neurological:     General: No focal deficit present.     Mental Status: She is alert.  Psychiatric:        Mood and Affect: Mood normal.        Behavior: Behavior normal.        Thought Content: Thought content normal.     No results found for any visits on 07/19/24.      Assessment & Plan:   Mild intermittent asthma without complication Assessment & Plan: Experiencing shortness of breath and wheezing despite current treatment. Seasonal allergies exacerbate symptoms. Current treatment not providing adequate relief. - Prescribed montelukast  to manage symptoms, noted potential side effect of vivid dreams. - Continue Symbicort twice daily and  albuterol  as needed. - Monitor response to montelukast , consider chest imaging if symptoms persist.    Lower leg edema Assessment & Plan: Swelling likely due to discontinuation of hydrochlorothiazide . No signs of deep vein thrombosis. - Prescribed spironolactone  to manage fluid retention. Advised to start with half tablet (12.5 mg) daily and can increase to full tablet (25 mg) if blood pressure supports it and swelling is not fully resolved at half-tablet dose.  - Advised wearing knee-high compression socks. - Recommended elevating legs when possible to reduce swelling -Petechial rash likely likely due to  blood vessel rupture from skin stretching secondary to edema. Expected to resolve as swelling decreases. Will cont to monitor.    Essential hypertension Assessment & Plan: BP Goal <130/80. Above goal today in office and on repeat. Per patient's blood pressure readings from home, BP well within goal at 110's-120's.  -Continue Valsartan  80 mg (half tablet twice daily).  - Start spironolactone  12.5-25 mg daily to support BP and reduce lower leg swelling  - Cont to avoid hydrochlorothiazide  due to increased triglyceride levels  - Recommend holding amlodipine  for now too due to potential s/e of LLE. If additional BP agent is needed, recommend against BB's due to asthma hx and recent exacerbation.  - Instructed to monitor blood pressure at home in AM and PM for the next two weeks and notify via MyChart of the results.  - Need to monitor potassium levels at her lab appt in October   Hyperlipidemia LDL goal <100 Assessment & Plan: Discontinued atorvastatin  2 weeks ago due to muscle aches and triglycerides not improving. Restarted on pravastatin , which she has been taking and is tolerating well. Continue pravastatin  for now with plans to recheck lipids in 6-8 weeks per Dr. Chandra.   Other orders -     Spironolactone ; Take 1 tablet (25 mg total) by mouth daily.  Dispense: 30 tablet; Refill: 2 -     Montelukast  Sodium; Take 1 tablet (10 mg total) by mouth at bedtime.  Dispense: 30 tablet; Refill: 2       Return if symptoms worsen or fail to improve.  Saddie JULIANNA Sacks, PA-C

## 2024-08-23 ENCOUNTER — Other Ambulatory Visit: Payer: Self-pay | Admitting: Family Medicine

## 2024-08-23 DIAGNOSIS — Z1231 Encounter for screening mammogram for malignant neoplasm of breast: Secondary | ICD-10-CM

## 2024-09-01 ENCOUNTER — Other Ambulatory Visit

## 2024-09-01 DIAGNOSIS — E785 Hyperlipidemia, unspecified: Secondary | ICD-10-CM

## 2024-09-02 ENCOUNTER — Ambulatory Visit: Payer: Self-pay | Admitting: Family Medicine

## 2024-09-02 LAB — COMPREHENSIVE METABOLIC PANEL WITH GFR
ALT: 18 IU/L (ref 0–32)
AST: 23 IU/L (ref 0–40)
Albumin: 3.9 g/dL (ref 3.9–4.9)
Alkaline Phosphatase: 98 IU/L (ref 49–135)
BUN/Creatinine Ratio: 10 — ABNORMAL LOW (ref 12–28)
BUN: 7 mg/dL — ABNORMAL LOW (ref 8–27)
Bilirubin Total: 0.3 mg/dL (ref 0.0–1.2)
CO2: 23 mmol/L (ref 20–29)
Calcium: 9.3 mg/dL (ref 8.7–10.3)
Chloride: 106 mmol/L (ref 96–106)
Creatinine, Ser: 0.68 mg/dL (ref 0.57–1.00)
Globulin, Total: 2.9 g/dL (ref 1.5–4.5)
Glucose: 88 mg/dL (ref 70–99)
Potassium: 4.1 mmol/L (ref 3.5–5.2)
Sodium: 143 mmol/L (ref 134–144)
Total Protein: 6.8 g/dL (ref 6.0–8.5)
eGFR: 94 mL/min/1.73 (ref >59)

## 2024-09-02 LAB — LIPID PANEL
Chol/HDL Ratio: 3.1 ratio (ref 0.0–4.4)
Cholesterol, Total: 210 mg/dL — ABNORMAL HIGH (ref 100–199)
HDL: 68 mg/dL (ref >39)
LDL Chol Calc (NIH): 116 mg/dL — ABNORMAL HIGH (ref 0–99)
Triglycerides: 150 mg/dL — ABNORMAL HIGH (ref 0–149)
VLDL Cholesterol Cal: 26 mg/dL (ref 5–40)

## 2024-09-02 MED ORDER — PRAVASTATIN SODIUM 40 MG PO TABS
40.0000 mg | ORAL_TABLET | Freq: Every day | ORAL | 1 refills | Status: AC
Start: 2024-09-02 — End: ?

## 2024-09-02 NOTE — Progress Notes (Signed)
 Called patient she is advised of her lab results and recommendation

## 2024-09-06 ENCOUNTER — Ambulatory Visit
Admission: RE | Admit: 2024-09-06 | Discharge: 2024-09-06 | Disposition: A | Discharge status: Home / Self Care | Source: Ambulatory Visit | Attending: Family Medicine | Admitting: Family Medicine

## 2024-09-06 DIAGNOSIS — Z1231 Encounter for screening mammogram for malignant neoplasm of breast: Secondary | ICD-10-CM | POA: Diagnosis not present

## 2024-09-09 ENCOUNTER — Telehealth: Payer: Self-pay | Admitting: Family Medicine

## 2024-09-09 NOTE — Telephone Encounter (Signed)
 Called pt LVM to contact the office if patient calls please advised

## 2024-09-09 NOTE — Telephone Encounter (Signed)
 Please call the patient and ask her if she ended up taking the amlodipine  or if she is just taking the valsartan  right now.  And ask if she is still having any dizziness.  Her blood pressure readings looked good on the sheet she provide.

## 2024-09-12 NOTE — Telephone Encounter (Signed)
 She can continue just taking the valsartan  and continue holding the amlodipine . Please encourage her to continue checking BP at home and updating us  with readings.

## 2024-09-12 NOTE — Telephone Encounter (Signed)
Called pt she is advised

## 2024-09-12 NOTE — Telephone Encounter (Signed)
 Called pt LVM to contact the opffice

## 2024-09-12 NOTE — Telephone Encounter (Signed)
 amlodipine  or if she is just taking the valsartan  right now.  And ask if she is still having any dizziness.  Her blood pressure readings looked good on the sheet she provide.         Patient is calling to report did not start amlodipine .Still is taking the Valsartan  And is not having any dizziness. Increased cholesterol medication as instructed.

## 2024-09-19 ENCOUNTER — Other Ambulatory Visit: Payer: Self-pay | Admitting: Family Medicine

## 2024-09-19 DIAGNOSIS — K219 Gastro-esophageal reflux disease without esophagitis: Secondary | ICD-10-CM

## 2024-10-10 ENCOUNTER — Other Ambulatory Visit: Payer: Self-pay | Admitting: Family Medicine

## 2024-10-11 ENCOUNTER — Other Ambulatory Visit: Payer: Self-pay | Admitting: Family Medicine

## 2024-10-18 ENCOUNTER — Other Ambulatory Visit: Payer: Self-pay

## 2024-11-07 ENCOUNTER — Encounter: Payer: Self-pay | Admitting: Family Medicine

## 2024-11-07 ENCOUNTER — Ambulatory Visit: Admitting: Family Medicine

## 2024-11-07 VITALS — BP 136/64 | HR 80 | Ht 64.5 in | Wt 172.4 lb

## 2024-11-07 DIAGNOSIS — E785 Hyperlipidemia, unspecified: Secondary | ICD-10-CM

## 2024-11-07 DIAGNOSIS — K5190 Ulcerative colitis, unspecified, without complications: Secondary | ICD-10-CM

## 2024-11-07 DIAGNOSIS — I10 Essential (primary) hypertension: Secondary | ICD-10-CM

## 2024-11-07 MED ORDER — AMLODIPINE BESYLATE 2.5 MG PO TABS: 2.5000 mg | ORAL_TABLET | Freq: Every day | ORAL | 3 refills | Status: AC

## 2024-11-07 MED ORDER — VALSARTAN 40 MG PO TABS: 40.0000 mg | ORAL_TABLET | Freq: Two times a day (BID) | ORAL | 3 refills | Status: AC

## 2024-11-07 NOTE — Patient Instructions (Signed)
" °  VISIT SUMMARY: Today, we reviewed your medications for managing your blood pressure and cholesterol. We made some adjustments to your prescriptions and rechecked your cholesterol levels.  YOUR PLAN: HYPERTENSION: Your blood pressure has been running in the 130s, but it improves with your current medications. -Take valsartan  40 mg, one tablet twice a day. -Take amlodipine  2.5 mg, one tablet once a day. -Continue taking spironolactone  as currently prescribed.  HYPERLIPIDEMIA: We need to re-evaluate your cholesterol levels. -We rechecked your lipid panel today.    "

## 2024-11-07 NOTE — Progress Notes (Unsigned)
" ° °  Established Patient Office Visit  Subjective   Patient ID: Carol Odom, female    DOB: Oct 22, 1956  Age: 68 y.o. MRN: [id number]  Chief Complaint  Patient presents with   Medical Management of Chronic Issues    Discussed the use of AI scribe software for clinical note transcription with the patient, who gave verbal consent to proceed.  History of Present Illness   Carol Odom is a 68 year old female with hypertension who presents for medication management.  She is currently taking valsartan  80 mg, which she splits into half doses, taking 40 mg in the morning and 40 mg at night.  She has been taking amlodipine  5 mg, which she started after noticing her blood pressure was running in the 130s. She initially took half a tablet due to caution with new medications, which effectively lowered her blood pressure to 'good readings'. She continues to take half a tablet once daily in the morning.  She is also on spironolactone , taking a full tablet as prescribed.  Her cholesterol management includes pravastatin , which was increased from atorvastatin . She notes that her cholesterol levels decreased after the switch, although not sufficiently. She has not had her cholesterol checked since the last adjustment.  She mentioned her blood pressure readings are usually low when taken first thing in the morning before medication, but it was higher today.          The 10-year ASCVD risk score (Arnett DK, et al., 2019) is: 13.8%  Health Maintenance Due  Topic Date Due   Hepatitis C Screening  Never done   COVID-19 Vaccine (1 - 2025-26 season) Never done      Objective:     BP 136/64   Pulse 80   Ht 5' 4.5 (1.638 m)   Wt 172 lb 6.4 oz (78.2 kg)   SpO2 97%   BMI 29.14 kg/m  {Vitals History (Optional):23777}  Physical Exam     Gen: alert, oriented Pulm: no respiratory distress Psych: pleasant affect       No results found for any visits on 11/07/24.      Assessment &  Plan:   Hyperlipidemia LDL goal <100 Assessment & Plan: Lipid levels to be re-evaluated today despite non-fasting state. - Rechecked lipid panel today.  Orders: -     Lipid panel  Essential hypertension Assessment & Plan: Blood pressure elevated at 130s, improved with current regimen. - Prescribed valsartan  40 mg, one tablet twice a day. - Prescribed amlodipine  2.5 mg, one tablet once a day. - Continue spironolactone  as currently prescribed.   Other orders -     Valsartan ; Take 1 tablet (40 mg total) by mouth 2 (two) times daily.  Dispense: 180 tablet; Refill: 3 -     amLODIPine  Besylate; Take 1 tablet (2.5 mg total) by mouth daily.  Dispense: 90 tablet; Refill: 3              Return in about 8 months (around 07/08/2025) for physical.    [name], MD  "

## 2024-11-07 NOTE — Assessment & Plan Note (Signed)
 Lipid levels to be re-evaluated today despite non-fasting state. - Rechecked lipid panel today.

## 2024-11-07 NOTE — Assessment & Plan Note (Signed)
 Blood pressure elevated at 130s, improved with current regimen. - Prescribed valsartan  40 mg, one tablet twice a day. - Prescribed amlodipine  2.5 mg, one tablet once a day. - Continue spironolactone  as currently prescribed.

## 2024-11-08 LAB — LIPID PANEL
Chol/HDL Ratio: 3.2 ratio (ref 0.0–4.4)
Cholesterol, Total: 220 mg/dL — ABNORMAL HIGH (ref 100–199)
HDL: 69 mg/dL
LDL Chol Calc (NIH): 118 mg/dL — ABNORMAL HIGH (ref 0–99)
Triglycerides: 190 mg/dL — ABNORMAL HIGH (ref 0–149)
VLDL Cholesterol Cal: 33 mg/dL (ref 5–40)

## 2024-11-09 DIAGNOSIS — K5190 Ulcerative colitis, unspecified, without complications: Secondary | ICD-10-CM | POA: Insufficient documentation

## 2024-11-09 NOTE — Assessment & Plan Note (Signed)
 In remission. Continue mesalamine as needed.

## 2024-11-14 ENCOUNTER — Ambulatory Visit: Payer: Self-pay | Admitting: Family Medicine

## 2024-11-14 NOTE — Progress Notes (Signed)
 Called pt she stated that she will give a call back she would like to do some reading on both medication before deciding on which one to get

## 2024-11-15 ENCOUNTER — Telehealth: Payer: Self-pay

## 2024-11-15 NOTE — Telephone Encounter (Signed)
 Copied from CRM [id number]. Topic: Clinical - Medication Question >> Nov 15, 2024  2:43 PM [name] wrote: Reason for CRM: patient stated she would like to take zetia  generic and would know if she can continue her other medications, please advise [phone]   [facility] - [place], [place] - [address] Carol Odom [name] Phone: [phone] Fax: [phone]

## 2024-11-16 MED ORDER — EZETIMIBE 10 MG PO TABS: 10.0000 mg | ORAL_TABLET | Freq: Every day | ORAL | 3 refills | Status: AC

## 2024-11-16 NOTE — Telephone Encounter (Signed)
Called pt she is advised

## 2024-11-16 NOTE — Telephone Encounter (Signed)
 I will send in the zetia  and yes she can continue taking her other medications with it.

## 2024-11-16 NOTE — Progress Notes (Signed)
 Pt is advised.

## 2024-11-16 NOTE — Addendum Note (Signed)
 Addended by: Marshel Golubski, DAN K on: 11/16/2024 07:41 AM   Modules accepted: Orders

## 2025-07-05 ENCOUNTER — Other Ambulatory Visit

## 2025-07-12 ENCOUNTER — Encounter: Admitting: Family Medicine

## 2025-08-10 ENCOUNTER — Ambulatory Visit
